# Patient Record
Sex: Male | Born: 1935 | Race: Black or African American | Hispanic: No | Marital: Married | Smoking: Former smoker
Health system: Southern US, Community
[De-identification: ages and names within clinical notes are randomized; demographics above are authoritative.]

## PROBLEM LIST (undated history)

## (undated) DIAGNOSIS — K219 Gastro-esophageal reflux disease without esophagitis: Secondary | ICD-10-CM

## (undated) DIAGNOSIS — I10 Essential (primary) hypertension: Secondary | ICD-10-CM

## (undated) DIAGNOSIS — I509 Heart failure, unspecified: Secondary | ICD-10-CM

## (undated) DIAGNOSIS — H04129 Dry eye syndrome of unspecified lacrimal gland: Secondary | ICD-10-CM

## (undated) HISTORY — DX: Dry eye syndrome of unspecified lacrimal gland: H04.129

## (undated) HISTORY — DX: Gastro-esophageal reflux disease without esophagitis: K21.9

## (undated) HISTORY — PX: OTHER SURGICAL HISTORY: SHX169

## (undated) HISTORY — DX: Essential (primary) hypertension: I10

---

## 1998-12-11 ENCOUNTER — Inpatient Hospital Stay (HOSPITAL_COMMUNITY): Admission: EM | Admit: 1998-12-11 | Discharge: 1998-12-14 | Payer: Self-pay | Admitting: Emergency Medicine

## 1998-12-12 ENCOUNTER — Encounter: Payer: Self-pay | Admitting: Nephrology

## 1998-12-13 ENCOUNTER — Encounter: Payer: Self-pay | Admitting: Nephrology

## 2000-09-05 ENCOUNTER — Observation Stay (HOSPITAL_COMMUNITY): Admission: EM | Admit: 2000-09-05 | Discharge: 2000-09-06 | Payer: Self-pay

## 2000-09-05 ENCOUNTER — Encounter: Payer: Self-pay | Admitting: Nephrology

## 2002-04-03 ENCOUNTER — Encounter: Payer: Self-pay | Admitting: Nephrology

## 2002-04-03 ENCOUNTER — Encounter: Payer: Self-pay | Admitting: Emergency Medicine

## 2002-04-03 ENCOUNTER — Inpatient Hospital Stay (HOSPITAL_COMMUNITY): Admission: EM | Admit: 2002-04-03 | Discharge: 2002-04-07 | Payer: Self-pay | Admitting: Emergency Medicine

## 2002-04-04 ENCOUNTER — Encounter: Payer: Self-pay | Admitting: Nephrology

## 2002-04-06 ENCOUNTER — Encounter (INDEPENDENT_AMBULATORY_CARE_PROVIDER_SITE_OTHER): Payer: Self-pay | Admitting: Specialist

## 2004-07-24 ENCOUNTER — Encounter: Admission: RE | Admit: 2004-07-24 | Discharge: 2004-07-24 | Payer: Self-pay | Admitting: Nephrology

## 2005-06-04 ENCOUNTER — Inpatient Hospital Stay (HOSPITAL_COMMUNITY): Admission: EM | Admit: 2005-06-04 | Discharge: 2005-06-09 | Payer: Self-pay | Admitting: Emergency Medicine

## 2005-06-07 ENCOUNTER — Ambulatory Visit: Payer: Self-pay | Admitting: Internal Medicine

## 2006-06-02 HISTORY — PX: CARDIAC SURGERY: SHX584

## 2006-10-25 ENCOUNTER — Inpatient Hospital Stay (HOSPITAL_COMMUNITY): Admission: EM | Admit: 2006-10-25 | Discharge: 2006-10-28 | Payer: Self-pay | Admitting: Emergency Medicine

## 2006-10-29 ENCOUNTER — Ambulatory Visit: Payer: Self-pay | Admitting: Gastroenterology

## 2007-03-17 ENCOUNTER — Ambulatory Visit: Payer: Self-pay | Admitting: Cardiothoracic Surgery

## 2007-03-18 ENCOUNTER — Encounter (INDEPENDENT_AMBULATORY_CARE_PROVIDER_SITE_OTHER): Payer: Self-pay | Admitting: Nephrology

## 2007-03-18 ENCOUNTER — Inpatient Hospital Stay (HOSPITAL_COMMUNITY): Admission: EM | Admit: 2007-03-18 | Discharge: 2007-03-27 | Payer: Self-pay | Admitting: Emergency Medicine

## 2007-03-22 ENCOUNTER — Encounter: Payer: Self-pay | Admitting: Cardiothoracic Surgery

## 2007-04-22 ENCOUNTER — Encounter: Admission: RE | Admit: 2007-04-22 | Discharge: 2007-04-22 | Payer: Self-pay | Admitting: Cardiothoracic Surgery

## 2007-04-22 ENCOUNTER — Ambulatory Visit: Payer: Self-pay | Admitting: Cardiothoracic Surgery

## 2007-09-14 ENCOUNTER — Inpatient Hospital Stay (HOSPITAL_COMMUNITY): Admission: EM | Admit: 2007-09-14 | Discharge: 2007-09-17 | Payer: Self-pay | Admitting: Emergency Medicine

## 2010-10-15 NOTE — Cardiovascular Report (Signed)
[name], MATTIX NO.:  [id number]   MEDICAL RECORD NO.:  [id number]          PATIENT TYPE:  INP   LOCATION:  2029                         FACILITY:  [facility]   PHYSICIAN:  Ricki Rodriguez, M.D.  DATE OF BIRTH:  1922-11-18   DATE OF PROCEDURE:  03/19/2007  DATE OF DISCHARGE:                            CARDIAC CATHETERIZATION   PROCEDURE:  Left heart catheterization and selective coronary  angiography.   REFERRING PHYSICIAN:  [name], M.D.   INDICATIONS:  This 77 year old black male has congestive heart failure,  hypertension and abnormal cardiac enzymes with recent TEE showing a  large atrial myxoma and a CT of the chest showing the same atrial mass.   APPROACH:  Right femoral artery using 4 French catheter and sheath.   COMPLICATIONS:  None.   Less than 40 mL of dye was used.   HEMODYNAMIC DATA:  The aortic pressure was 130 x 62.   Left atrial mobile mass with vascular supply was partially seen.   CORONARY ANATOMY:  The left main coronary artery was long with luminal  irregularities.   LEFT ANTERIOR DESCENDING CORONARY ARTERY:  The left anterior descending  coronary artery was essentially unremarkable and its diagonal vessel was  also unremarkable.   LEFT CIRCUMFLEX CORONARY ARTERY:  The left circumflex coronary artery  was unremarkable, had a long slender ramus branch and obtuse marginal  branch was unremarkable.   RIGHT CORONARY ARTERY:  The right coronary artery was dominant and  essentially unremarkable.  His posterolateral branch was a slender  vessel and the posterior descending coronary artery was unremarkable.   LEFT VENTRICULOGRAM:  The left ventriculogram was not done but the left  ventricle systolic function was normal by transesophageal  echocardiography.   IMPRESSION:  1. Normal coronaries.  2. Normal LV (left ventricle) systolic function.  3. 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   RECOMMENDATIONS:  This patient  was referred to cardiovascular thoracic  surgeon for surgery for atrial myxoma.      Ricki Rodriguez, M.D.  Electronically Signed     ASK/MEDQ  D:  03/19/2007  T:  03/21/2007  Job:  [id number]

## 2010-10-15 NOTE — Consult Note (Signed)
NAMEANGELA, PLATNER NO.:  [id number]   MEDICAL RECORD NO.:  [id number]          PATIENT TYPE:  INP   LOCATION:  2029                         FACILITY:  [facility]   PHYSICIAN:  Sheliah Plane, MD    DATE OF BIRTH:  26-Jul-1922   DATE OF CONSULTATION:  03/19/2007  DATE OF DISCHARGE:                                 CONSULTATION   REQUESTING PHYSICIAN:  Ricki Rodriguez, M.D.   FOLLOW-UP CARDIOLOGIST:  Ricki Rodriguez, M.D.   PRIMARY CARE PHYSICIAN:  [name], M.D.   REASON FOR CONSULTATION:  Myxoma.   HISTORY OF PRESENT ILLNESS:  The patient is an 77 year old male who over  the years has not had a lot of significant medical problems, first  admitted about 2 weeks ago with an episode of shortness of breath with  exertion.  He notes that he walks about a mile a day, somewhat limited  by chronic arthritic changes in his knees.  With rest the shortness of  breath resolved.  Again, two nights ago while watching presidential  debates on TV the patient became short of breath at rest, called 9-1-1,  and was brought to the emergency room and he was started on oxygen and  noted that he had improved symptoms.  He has had no previous history of  myocardial infarction, cardiac catheterization or angioplasty.  He does  have a history of hypertension.  Lipid status is unknown.  No history of  diabetes.  He is a remote smoker, quit in 1970.   FAMILY HISTORY:  The patient's father died at age 75 of emphysema  secondary to heavy smoking history and his mother died at age 20 of a  myocardial infarction.  He has one sister, who has GI problems.  He  has had no previous stroke.  Denies claudication.   PAST MEDICAL HISTORY:  1. Glaucoma.  2. History of diverticulosis.  He has had a lower GI tract bleed in      the past.  The patient thinks that in April or May of 2008 he had      upper and lower GI endoscopy done locally.   PAST SURGICAL HISTORY:  Left neck surgery for a  benign cyst 6 years ago.   SOCIAL HISTORY:  The patient is married, retired [employer] social research officer.  Does not currently drink alcohol.   MEDICATIONS AT THE TIME OF ADMISSION:  The patient was unsure of the  names.  The patient's record indicates that he takes:   1. Amlodipine 10 mg a day.  2. Doxazosin 2 mg a day.  3. Lasix 40 mg a day.  4. Omeprazole 20 mg a day.  5. Optivar ophthalmic drops daily.   The patient also notes that he takes iron and an arthritis medicine.  He  was recently started on Lanoxin 0.25 mg a day, was given one 75-mg dose  of Plavix and 81 mg of __________.   DRUG ALLERGIES:  None known.   REVIEW OF SYSTEMS:  CARDIAC:  Positive for exertional shortness of  breath, lower  extremity edema.  He denies palpitations, syncope,  presyncope, orthopnea, or resting shortness of breath with the exception  of this admission.  He denies any chest pain.  GENERAL:  The patient  notes that he has had some constitutional symptoms.  His wife notes  generalized fatigue over the last 6 months.  He denies fever, chills,  night sweats or change in weight.  RESPIRATORY:  He denies hemoptysis.  GASTROINTESTINAL:  History of diverticulosis with lower GI bleed in the  past as noted above.  NEUROLOGIC:  Denies amaurosis or TIAs.  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  GU:  Denies hematuria.  Does note some nocturia.  Denies psychiatric history.  Denies amaurosis or TIAs.  Has poor  dentition.   On exam, blood pressure 110/60, sinus at 90, respiratory rate is 18, O2  saturation is 96% on 2 L.  He is feet 10 inches tall, 231 pounds.  The patient is awake, alert, neurologically intact.  NECK:  Without carotid bruits.  LUNGS:  Clear bilaterally.  CARDIAC:  Regular rate and rhythm without murmur or gallop.  ABDOMEN:  Specifically, there is no tumor plop appreciated.  Abdominal  exam is benign without palpable masses or  tenderness.  LOWER EXTREMITIES:  He has somewhat diffusely enlarged knees  bilaterally.  Has +2 DP and PT pulses bilaterally.   Cardiac catheterization films are reviewed.  The patient has no  significant coronary disease.  Echocardiogram was also reviewed and a  4.2-cm pedunculated mass in the left atrium with a fairly small base  attached to the intra-atrial septum is noted.  The mass does approach to  the orifice of the mitral valve in diastole but does not actually go  through the valve.  Overall LV function appears preserved.   IMPRESSION:  Patient with symptoms of congestive heart failure based on  a left atrial myxoma.  With the patient's fairly acute onset of  symptoms, I agree with Dr. Algie Coffer that we should proceed with cardiac  surgery for removal of atrial myxoma.  The diagnosis, risks and options  including the risks of surgery including death, infection, stroke,  myocardial infarction, bleeding, blood transfusion, are all discussed  with the patient and his wife and son in detail.  Their questions have  been answered and the patient is willing to proceed.  We will  discontinue his aspirin and Plavix, check fasting lipid studies and  thyroid function and plan to proceed with excision of myxoma on Monday,  October 20.      Sheliah Plane, MD  Electronically Signed     EG/MEDQ  D:  03/19/2007  T:  03/19/2007  Job:  [id number]   cc:   Ricki Rodriguez, M.D.  [name], M.D.

## 2010-10-15 NOTE — Discharge Summary (Signed)
[name], [name] NO.:  [id number]   MEDICAL RECORD NO.:  [id number]          PATIENT TYPE:  INP   LOCATION:  2011                         FACILITY:  [facility]   PHYSICIAN:  Sheliah Plane, MD    DATE OF BIRTH:  18-Nov-1922   DATE OF ADMISSION:  03/17/2007  DATE OF DISCHARGE:  03/27/2007                               DISCHARGE SUMMARY   PRIMARY ADMITTING DIAGNOSIS:  Shortness of breath.   ADDITIONAL/DISCHARGE DIAGNOSES:  1. Atrial myxoma.  2. History of diverticulosis, status post gastrointestinal bleed in      the past.  3. History of glaucoma.  4. Perioperative bradycardia.   PROCEDURES PERFORMED:  1. Excision of left atrial myxoma.  2. Ligation of left atrial appendage.   HISTORY:  The patient is an 77 year old male with no significant past  medical history.  He began to develop dyspnea on exertion approximately  2 weeks prior to this admission.  On the date of this admission he  developed significant shortness of breath at rest and was brought by EMS  to the emergency department for further evaluation.  Because of his  recurrent symptoms he was seen by Dr. Bascom Levels and was admitted for  further workup.   HOSPITAL COURSE:  Because of his symptoms, a cardiology consultation was  obtained and the patient was seen by Dr. Algie Coffer.  He underwent cardiac  catheterization, which showed no significant coronary artery disease.  An echocardiogram was also performed and he was noted to have a 4.2-cm  pedunculated mass in the left atrium with a fairly small base attached  to the intra-atrial septum..  It was felt that this represented a left  atrial myxoma and that subsequently was causing symptoms of congestive  heart failure.  A cardiothoracic surgery consultation was obtained and  the patient was seen by Dr. Sheliah Plane and films were reviewed.  Dr. Tyrone Sage agreed that his best course of action would be to proceed  with surgical resection of the mass at  this time.  He explained the  risks, benefits and alternatives of the procedure to the patient and he  agreed to proceed with surgery.  He remained stable in hospital during  his preoperative workup.  He was taken to the operating room on March 22, 2007, and underwent a sternotomy with excision of left atrial myxoma  and ligation of left atrial appendage.  He tolerated the procedure well  and was transferred to the SICU in stable condition.  Postoperatively he  was able to be extubated shortly after surgery.  He was hemodynamically  stable on postop day #1.  He was kept in the unit for further  observation and by postop day #2 was transferred to the floor.  His only  postoperative issue has been some bradycardia, which has required atrial  pacing.  He was originally started on a beta blocker postoperatively but  this was discontinued.  His atrial pacer has been discontinued at this  point and he is maintaining heart rates of 70-80.  His blood pressure  has been mildly elevated and he  was started on an ACE inhibitor.  He has  been started on Lasix and is diuresing well for a mild volume overload.  His incisions are all healing well.  His most recent chest x-ray showed  no evidence of infection or acute process.  Labs on postop day #4 showed  sodium 144, potassium 4.0, BUN 7, creatinine 0.92.  His most recent CBC  showed a hemoglobin of 10, hematocrit 31, white count 7.3, platelets  154.  He is ambulating with cardiac rehab phase 1 and is making good  progress.  He is tolerating a regular diet and is having normal bowel  and bladder function.  He will be observed over the next 24 hours off  the pacer and it if his rhythm remains stable and no other acute changes  occur, hopefully he will be ready for discharge home pending morning  round evaluation on March 27, 2007.   DISCHARGE MEDICATIONS:  1. Enteric-coated aspirin 325 mg daily.  2. Omeprazole 20 mg daily.  3. Optivar eye drops  daily.  4. Travatan eye drops daily.  5. Protonix 40 mg daily.   DISCHARGE INSTRUCTIONS:  He is asked to refrain from driving, heavy  lifting or strenuous activity.  He may continue ambulating daily and  using his incentive spirometer.  He may shower daily and clean his  incisions with soap and water.  He will continue a low-fat, low-sodium  diet.   DISCHARGE FOLLOW-UP:  He will see Dr. Algie Coffer in the office in 2 weeks.  He will follow up then with Dr. Tyrone Sage on November 20 at 1 p.m. with a  chest x-ray from [facility].  In the interim if he experiences  problems or has questions, he is asked to contact our office  immediately.      Coral Ceo, P.A.      Sheliah Plane, MD  Electronically Signed    GC/MEDQ  D:  03/26/2007  T:  03/27/2007  Job:  [id number]   cc:   [name], M.D.  Ricki Rodriguez, M.D.

## 2010-10-15 NOTE — Op Note (Signed)
[name], [name] NO.:  [id number]   MEDICAL RECORD NO.:  [id number]          PATIENT TYPE:  INP   LOCATION:  4706                         FACILITY:  [facility]   PHYSICIAN:  Georgiana Spinner, M.D.    DATE OF BIRTH:  12-23-1922   DATE OF PROCEDURE:  10/27/2006  DATE OF DISCHARGE:                               OPERATIVE REPORT   PROCEDURE:  Colonoscopy.   INDICATIONS:  Acute rectal bleeding.   ANESTHESIA:  Demerol 75 mg, Versed 5 mg.  The patient's prep was good.  The patient stated that this started out bloody, but became urine  colored with small particles this morning.   PROCEDURE:  With the patient mildly sedated in the left lateral  decubitus position, a rectal examination revealed a enlarged prostate.  Subsequently the Pentax videoscopic colonoscope was inserted into the  rectum, passed under direct vision through a diverticular filled sigmoid  colon through a diverticular filled the right colon to reach the cecum.  Cecum was identified by ileocecal valve and appendiceal orifice both of  which were photographed, from this point the colonoscope was slowly  withdrawn taking circumferential views of colonic mucosa, stopping to  take photographs of the diverticula seen both in the right and left  colon until we reached the rectum which appeared normal on direct and  retroflexed view.  The endoscope was straightened and withdrawn.  The  patient's vital signs, pulse oximeter remained stable.  The patient  tolerated procedure well without apparent complication.   FINDINGS:  Diffuse diverticulosis more so on the left but still  significant in the right colon otherwise an unremarkable exam.   PLAN:  At this point it appears the patient has stopped bleeding.  There  was no blood seen in the colon at this time, will advance diet and  follow the patient clinically.  If  remains stable, consider discharge  in the morning.           ______________________________  Georgiana Spinner, M.D.     GMO/MEDQ  D:  10/27/2006  T:  10/27/2006  Job:  [id number]   cc:   [name], M.D.

## 2010-10-15 NOTE — Op Note (Signed)
NAMEJASSON, SIEGMANN NO.:  [id number]   MEDICAL RECORD NO.:  [id number]          PATIENT TYPE:  INP   LOCATION:  2011                         FACILITY:  [facility]   PHYSICIAN:  Sheliah Plane, MD    DATE OF BIRTH:  02/03/1923   DATE OF PROCEDURE:  03/22/2007  DATE OF DISCHARGE:  03/27/2007                               OPERATIVE REPORT   PREOPERATIVE DIAGNOSIS:  Atrial myxoma.   POSTOPERATIVE DIAGNOSIS:  Atrial myxoma.   SURGICAL PROCEDURES:  Excision of left atrial myxoma and ligation of  left atrial appendage.   SURGEON:  Sheliah Plane, MD   FIRST ASSISTANT:  Zadie Rhine, PA   BRIEF HISTORY:  The patient is an 77 year old male who had no previous  cardiac history, had about two weeks prior to surgery had onset of  exertional shortness of breath which resolved and then several days  prior to surgery was admitted to the hospital because of recurrent  shortness of breath.  He was seen in the emergency room.  CT scan of the  chest was performed to rule out pulmonary embolus.  This demonstrated a  intracardiac mass.  Echocardiogram confirmed a probable left atrial  myxoma about 5 cm in size.  Cardiac catheterization was performed.  There was no evidence of significant coronary disease.  The patient was  otherwise in good health in surgical resection was recommended.  The  patient agreed and signed informed consent.   DESCRIPTION OF PROCEDURE:  With Swan-Ganz and arterial line monitors in  place, the patient underwent general endotracheal anesthesia without  incident.  Skin of the chest and legs prepped with Betadine and draped  in the usual sterile manner.  Sternotomy was performed.  Pericardium was  opened.  TEE probe had been placed and dictated under separate note but  confirmed a large myxoma appearing tumor attached to the foramen ovalis.  The patient was systemically heparinized.  The ascending aorta was  cannulated.  Superior and inferior vena  caval cannulas were placed.  Right-angle counts were placed directly into the superior and inferior  vena cava and caval tapes were placed around the superior and inferior  vena cava.  In the aortic root bent cardioplegia needle was introduced  in this ascending aorta.  The patient was placed on cardiopulmonary  bypass 2.4 liters per minute per meter squared.  Body temperature was  cooled to 30 degrees.  Aortic crossclamp was applied and 500 mL of cold  blood potassium cardioplegia was administered with diastolic arrest of  the heart.  The right atrium was __________ with arrest of the heart and  myocardial septal temperature was monitored.  Caval tapes were secured.  The right atrium was opened through the right atrial interatrial septum  in the vicinity of fossa ovalis.  The left atrium was entered in the  area of attachment as a pedicle was excised with full-thickness of  atrial wall __________ atrial opening was then enlarged enough to  extract what appeared grossly as a myxoma from the left atrium.  Care  was taken to flush the left atrium  and remove any loose debris.  The  left atrial appendage was easily identified and was oversewn with a  running 4-0 Prolene.  With the intracardiac tumor successfully removed,  the interatrial septum was then closed with a horizontal mattress 4-0  Prolene suture and a second layer of running 4-0 Prolene.  Before  complete closure of atrium, the heart was allowed to passively fill and  de-air the left side.  The right atrium was then closed in a similar  fashion with a running 4-0 Prolene.  Aortic crossclamp was removed.  Total crossclamp time of 51 minutes.  The right heart was filled and de-  aired and the right atrial atriotomy was closure was completed.  The  patient spontaneously converted to a sinus rhythm, further air was  removed through the aortic root vent.  Atrial and ventricular pacing  wires were applied with the patient's body  temperature rewarmed to 37  degrees.  He was then ventilated and weaned from cardiopulmonary bypass  without difficulty, he was decannulated in usual fashion.  Protamine  sulfate was administered.  Total pump time was 77 minutes.  The  atriotomy closure was intact.  The TEE showed intact atrial septum and  overall good LV function.  The pericardium was loosely reapproximated  two Blake drains were left in place.  Sternum was closed #6 stainless  steel wire.  Fascia closed interrupted 0 Vicryl running 3-0 Vicryl  subcutaneous tissue, 4-0 subcuticular stitch in skin edges.  Dry  dressings were applied.  The patient was transferred to surgical  intensive care unit in good condition having tolerated procedure without  obvious complication.      Sheliah Plane, MD  Electronically Signed     EG/MEDQ  D:  03/30/2007  T:  03/31/2007  Job:  [id number]   cc:   [name], M.D.  Ricki Rodriguez, M.D.

## 2010-10-15 NOTE — Assessment & Plan Note (Signed)
OFFICE VISIT   [name], [name]  DOB:  03/09/1923                                        April 22, 2007  CHART #:  [id number]   HISTORY OF PRESENT ILLNESS:  [name] returns to the office today after  resection of his atrial myxoma and closure of the left atrial appendage  on March 22, 2007. Considering his age of 48 years, the patient is  taking excellent progress. Since discharge home, he has increased his  physical activity appropriately. He was interested in doing more  activity. He has had no overt symptoms of congestive heart failure. He  was noted in Dr. Roseanne Kaufman office to be in atrial fibrillation when he  was seen on November 17th and was started on Cardizem, which has slowed  is rate into the 60's. On examination today, he still appears to be in  atrial fibrillation. The left atrial appendage was closed at the time of  surgery. Ideally, it would be good to have the patient on Coumadin to  decrease his stroke risk with atrial fibrillation. However, he has had a  history of multiple GI bleeds, requiring hospitalization, at least 4  times with lower GI bleeds. The risk of Coumadin and/or aspirin would be  great in this patient and so far, has not been started.   PHYSICAL EXAMINATION:  VITAL SIGNS:  Blood pressure 152/81, pulse  62,  respiratory rate 18. O2 sat is 97%.  CHEST:  His sternum is stable and well healed.  LUNGS:  Clear bilaterally.  CARDIOVASCULAR:  I do not hear any murmur or mitral insufficiency.  EXTREMITIES:  He has mild bilateral lower extremity edema but no worse  than the usual.   DIAGNOSTIC STUDIES:  Followup chest x-ray showed clear lung fields  bilaterally.   IMPRESSION:  Overall, [name] is making good progress following the  excision of his left atrial myxoma, which was large enough to be causing  hemodynamic consequences. His main problem now appears to be atrial  fibrillation with the rate controlled and a  history of GI bleed that  makes use of Coumadin contraindicated.   The patient has a followup appointment with Dr. Algie Coffer in 2 weeks, to  check on his heart rhythm. I plan to see him back p.r.n.   Sheliah Plane, MD  Electronically Signed   EG/MEDQ  D:  04/22/2007  T:  04/22/2007  Job:  [id number]   cc:   Ricki Rodriguez, M.D.  [name], M.D.

## 2010-10-18 NOTE — Discharge Summary (Signed)
[name], RUDDY NO.:  [id number]   MEDICAL RECORD NO.:  [id number]          PATIENT TYPE:  INP   LOCATION:  6742                         FACILITY:  [facility]   PHYSICIAN:  [name], M.D.DATE OF BIRTH:  Feb 11, 1923   DATE OF ADMISSION:  06/04/2005  DATE OF DISCHARGE:  06/09/2005                                 DISCHARGE SUMMARY   ADMISSION DIAGNOSIS:  Gastrointestinal hemorrhage.   BRIEF HISTORY AND HOSPITAL COURSE:  The patient is an 77 year old African-  American male who came to the emergency room because he saw blood in the  commode at home on the morning of this admission.  He also noted blood on  the paper for several days prior to admission. Therefore he came to the  emergency room where he had a stool that almost completely filled the bed  pan with blood.  He had seen Dr. Virginia Rochester in 2003.  He was scoped at that time.  He had done well until this recent episode.  He denied having any pain.  He  came in with his wife.  He is retired. He has a history of BPH and  congestive heart failure in the past.  He has arthritis and was on Feldene  10 mg and ranitidine 150 mg, furosemide 40 mg, and doxazosin 2 at night.  He  had BPH, and he has had a great deal of problem with his blood pressure.   PHYSICAL EXAMINATION:  VITAL SIGNS: Temperature 97, pulse 103, blood  pressure 152/84, respirations 20.  GENERAL:  Alert and oriented x3.  HEENT:  Negative.  He does have many missing teeth and a partial upper and  lower.  NECK:  Veins were flat.  CHEST: Clear to auscultation and percussion.  CARDIAC: Regular sinus rhythm.  ABDOMEN: Active bowel sounds, no masses and no organomegaly.  RECTAL: He had good sphincter tone but an enlarged prostate, 4+ guaiac  positive.  NEUROLOGIC:  He can move all his extremities.  His reflexes are bilateral  and equal.  EXTREMITIES:  He had 2+ swelling of both legs.  He wears compression hose.   White count was 6.6, hemoglobin  14.2, so would think of this as dehydration.  Pro time was 13.1, INR 1.  Electrolytes and chemistries were normal.  Glucose 147, BUN 11, creatinine 1.   Chest x-ray was no evidence of congestive heart failure.   The patient was admitted to 6700, and other pertinent labs showed PSA a  little elevated at 5.98 and then 7.75.  Hemoglobin A1c was normal.  Albumin  was a little low at 3.5 and then down to 2.7.  Repeat occult blood was  positive.  As we hydrated the patient, his hemoglobin went all the way down  to 7.6, and we have to give some transfusion.  We did a serum protein  electrophoresis, and it showed a nonspecific pattern.   Dr. Virginia Rochester saw the patient in the hospital for his gastrointestinal bleeding.  Scan was done, and blood pool was normal.  He was not actively bleeding at  that time.  EKG had normal sinus rhythm, nonspecific ST-T wave changes.   The patient was felt improved, and the patient did not have any more GI  bleed as we observed him. We gave him some blood.  He was sitting up, alert.  Hemoglobin was 9.6, hematocrit 28.2.  Temperature 97.6, pulse 88,  respirations 20, blood pressure 140/70.  O2 saturation 98% on room air.  The  patient was discharged on Protonix 40 mg a day, Nu-Iron 150 twice daily and  his other home medications he is to continue except for felodipine.  We do  not want him to continue that.  He will come to my office in 3 to 4 weeks.   DISCHARGE DIAGNOSES:  1.  Gastrointestinal hemorrhage or bleed.  2.  Diverticulosis of the colon.  3.  Benign prostatic hypertrophy.           ______________________________  [name], M.D.     CEF/MEDQ  D:  08/28/2005  T:  08/28/2005  Job:  [id number]

## 2010-10-18 NOTE — Discharge Summary (Signed)
[name], [name] NO.:  [id number]   MEDICAL RECORD NO.:  [id number]          PATIENT TYPE:  INP   LOCATION:  6740                         FACILITY:  [facility]   PHYSICIAN:  [name], M.D.DATE OF BIRTH:  March 16, 1923   DATE OF ADMISSION:  09/14/2007  DATE OF DISCHARGE:  09/17/2007                               DISCHARGE SUMMARY   ADMITTING DIAGNOSIS:  Gastrointestinal hemorrhage and bleeding.   DISCHARGE DIAGNOSES:  1. Gastrointestinal hemorrhage.  2. Atrial flutter.  3. Benign prostate hypertrophy.  4. Esophageal reflux.  5. 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  6. Congestive heart failure.  7. Glaucoma.  8. Atrial fibrillation, controlled.  9. Plasma protein __________ [name].  10.Tobacco abuse.  11.Hypertension.  12.Hemorrhoids.   BRIEF HISTORY, PHYSICAL, AND HOSPITAL COURSE:  [name] is an 77-year-  old African American male with history of several GI bleed in the past.  States that he noted last night that he had some blood in his stool.  Again, he noted this morning that he has some Kaufman blood in his stool.  He has a history of diverticulitis and diverticulosis, and he had severe  GI bleed, causing to have extremely low hemoglobin.  He also has a  history of rapid heart rate, controlled with Cardizem.  He has been  seeing Dr. Algie Coffer, a cardiologist, in the past.  The patient presents  with his wife.   PHYSICAL EXAMINATION:  VITAL SIGNS:  Temperature of 97.1, pulse of 115,  blood pressure 171/93, and respirations are 24.  HEENT:  He has poor dentition.  His extraocular movements were normal.  CHEST:  Clear to auscultation and percussion.  HEART:  Regular sinus rhythm.  ABDOMEN:  He has active bowel sounds.  EXTREMITIES:  He has 1+ to 2+ pitting edema in both legs.  He denies  shortness of breath or leg pain.   He has seen Dr. Virginia Rochester in the past for GI bleeding.  He complains of no  chest pain, and his chest is clear.  Probably,  he has some diverticular  disease.  He has had them in the past.  The patient was admitted.  We  gave him 2 units of packed cells.  His hemoglobin on admission was 10.8.  We gave him a couple units of blood.   LABORATORY DATA:  Electrolytes were fairly normal.  EKG, he had some  sinus bradycardia at one point, gradually that improved.  He has had  some bouts of atrial flutter, gradually brought back into sinus.  Chest  was clear.  __________; however, the bleeding seemingly has improved.  He did not bleed as briskly as he has in the past.  Dr. Virginia Rochester saw the  patient, but he had stopped bleeding by then.  Since he had had a  colonoscopy last year, __________ does not want to do one at this time.  He did have a mild racing heart rate, did slow down to 39.  His blood  pressure was stable, and it went back up to, sometimes the pulse goes  even up  to 127.  One thing that the patient's PSA is 5.89.  On  __________ what we would do is have him see urology after discharge as  an outpatient, and he is scheduled to see his cardiologist as an  outpatient.  We did ask Dr. Algie Coffer to see him while he is in here and  increased his Cardizem by 50%.  He took his Cardizem down, and then his  heart rate went up to 127.  So, we had to readjust it.  His temp was  97.3.  His pulse went up to 127 and blood pressure 108/70.  He was then  put back on his Lopressor and discharged on Lopressor, and we will  restart amlodipine as needed as an outpatient; also Doxazosin, he was on  2 mg; and we will restart Lasix 40 mg as needed as an outpatient;  continue his Protonix 40 mg a day.   On discharge, he was discharged on metoprolol 50 mg b.i.d.  Restart his  potassium, KCl 10 mg one daily, Cardizem 60 mg twice daily, and  lisinopril 5 mg one daily.  The patient is discharged to home.  We will  see him in about 2 weeks, and he is to see Dr. Algie Coffer as an outpatient  as scheduled.            ______________________________  [name], M.D.     CEF/MEDQ  D:  10/21/2007  T:  10/22/2007  Job:  [id number]

## 2010-10-18 NOTE — Op Note (Signed)
   NAMEDEUNDRA, BARD NO.:  [id number]   MEDICAL RECORD NO.:  [id number]                   PATIENT TYPE:  INP   LOCATION:  4727                                 FACILITY:  [facility]   PHYSICIAN:  Georgiana Spinner, M.D.                 DATE OF BIRTH:  [birth year]   DATE OF PROCEDURE:  DATE OF DISCHARGE:                                 OPERATIVE REPORT   PROCEDURE:  Upper endoscopy.   INDICATIONS:  Gastrointestinal bleeding.   ANESTHESIA:  Demerol 50 mg, Versed 5 mg.   DESCRIPTION OF PROCEDURE:  With the patient mildly sedated in the left  lateral decubitus position, the Olympus videoscopic endoscope was inserted  in the mouth, passed under direct vision through the esophagus, which  appeared normal except for a questionable area of short-segment Barrett's  esophagus, which was photographed and biopsied.  We entered into the  stomach.  Fundus, body, antrum, duodenal bulb, second portion of duodenum  all appeared normal.  From this point the endoscope was slowly withdrawn,  taking circumferential views, the entire duodenal mucosa was visualized,  until the endoscope had been pulled back in the stomach, placed in  retroflexion to view the stomach from below.  The endoscope was then  straightened and withdrawn, taking circumferential views of the remaining  gastric and esophageal mucosa.  The patient's vital signs and pulse oximetry  remained stable.  The patient tolerated the procedure well and without  apparent complications.   FINDINGS:  Question of short-segment Barrett's esophagus, photographed and  biopsied.   PLAN:  Await biopsy report.  The patient will call me for results and follow  up as an outpatient.  Proceed to colonoscopy as planned.                                               Georgiana Spinner, M.D.    GMO/MEDQ  D:  04/06/2002  T:  04/06/2002  Job:  [id number]   cc:   [name], M.D.  [address] [place]  [place]  [address]  Fax: [phone]

## 2010-10-18 NOTE — Discharge Summary (Signed)
[name], [name] NO.:  [id number]   MEDICAL RECORD NO.:  [id number]                   PATIENT TYPE:  INP   LOCATION:  4727                                 FACILITY:  [facility]   PHYSICIAN:  [name], M.D.            DATE OF BIRTH:  10/08/1922   DATE OF ADMISSION:  04/03/2002  DATE OF DISCHARGE:  04/07/2002                                 DISCHARGE SUMMARY   ADMISSION DIAGNOSIS:  Gastrointestinal bleeding.   DISCHARGE DIAGNOSIS:  Gastrointestinal bleeding.   HISTORY OF PRESENT ILLNESS:  The patient is a 77 year old African American  gentleman with a history of hypertension and he was admitted with his second  episode of rectal bleeding in the past three years.  The patient was found  by his wife early on the morning of admission, on the floor of the bathroom  and commode full of red and maroon colored blood.  The patient said he was  dizzy and after he got up from the bowel movement, he fell to the floor and  hit his head.  He was brought to the emergency room.  He also had some  bleeding in the emergency room with evacuating his bowel.  The patient had  presented similarly about three years ago.  He was given some blood.  He had  significant diverticulosis without any evidence of diverticulitis.  The  patient has had no subsequent rectal bleeding until this particular episode.   MEDICATIONS:  1. Furosemide daily.  2. Atenolol 25 mg a day.  3. Enteric coated aspirin.  4. Terazosin 2 mg h.s.  5. Ranitidine 150 mg b.i.d.   The patient denies any abdominal pain or discomfort but has some nausea.   PHYSICAL EXAMINATION:  VITAL SIGNS:  Blood pressure 98/57, pulse 100,  respiratory rate 20, temperature 92.  Blood pressure would drop to 84/40,  pulse 120.  HEENT:  The patient is anicteric.  NECK:  Supple.  CHEST:  No rales.  Clear lungs. No tenderness or problems with his chest.  CARDIOVASCULAR:  Regular rhythm, no gallops or rubs.  ABDOMEN:  Soft, nontender.  Slight increase in bowel sounds.  NEUROLOGIC:  The patient is alert, no focal signs.   EKG is normal sinus rhythm with rate of 98, nonspecific T-wave changes.   HOSPITAL COURSE:  A patient with rectal bleeding, probably from his  diverticular disease.  However, it was his second bleed and we probably  should get him scoped.  The patient was transfused several units of blood.  We got Dr. Sabino Gasser to see the patient.  The patient was seen by Dr. Virginia Rochester  who did an endoscopy.  He had some internal hemorrhoids, a small polyp at 10  cm, polyp at 50 cm that were removed.  Diverticulosis was seen throughout  the colon.  The colon showed adenomatous changes but no high grade dysplasia  or  invasive malignancy.  The patient continued to feel improved and receive  his flu and pneumonia vaccine in the hospital.   DISCHARGE MEDICATIONS:  We will discharge him on his present medications.   FOLLOW UP:  He is to return to the office in two weeks.  He is to see Dr.  Virginia Rochester in two to three weeks.  He will probably get some cardiology evaluation  as an outpatient.                                               [name], M.D.    CEF/MEDQ  D:  05/18/2002  T:  05/19/2002  Job:  [id number]

## 2010-10-18 NOTE — Discharge Summary (Signed)
[name], [name] NO.:  [id number]   MEDICAL RECORD NO.:  [id number]          PATIENT TYPE:  INP   LOCATION:  4706                         FACILITY:  [facility]   PHYSICIAN:  [name], M.D.DATE OF BIRTH:  August 30, 1922   DATE OF ADMISSION:  10/25/2006  DATE OF DISCHARGE:  10/28/2006                               DISCHARGE SUMMARY   ADMISSION DIAGNOSIS:  Gastrointestinal hemorrhage.   DISCHARGE DIAGNOSES:  1. Gastrointestinal hemorrhage.  2. Diverticulosis of the colon with hemorrhage.  3. Hypertension.  4. Internal hemorrhoids.  5. Benign prostatic hypertrophy of the prostate.  No evidence of      urinary obstruction.   HISTORY OF PRESENT ILLNESS:  [name] is an 77 year old African  American gentleman who was brought to the emergency room complaining of  some pain and rectal bleeding.  He awoke from sleep.  He had the urge to  pass his bowels, to defecate, and he went to the bathroom and he found  __________ a large amount of dark, maroon-colored stool.  There was no  pain or dizziness, no abdominal pain or vomiting.  He had a similar  event that happened about a year-and-a-half prior to this.  He was seen  by GI, Dr. Biagio Borg, at that time.  They thought he had a Barrett's esophagus.  On colonoscopy he had some small internal hemorrhoids and  diverticulosis.  He had several polyps, 20 cm and 50 cm polyps that were  removed and found to be benign.  The patient also has an enlarged  prostate and he is on Norvasc 5 mg, doxazosin 4 mg, furosemide 40 mg per  day, and Prilosec 20 mg per day.  He is not on aspirin or Coumadin.  He  is not a drinker.  He does not smoke cigarettes.  No drug abuse.   PHYSICAL EXAMINATION:  HEENT:  Physical examination revealed his HEENT  is negative.  CHEST:  His chest was clear to auscultation and percussion.  VITAL SIGNS:  Blood pressure 128/80, pulse 92, respirations normal,  temperature 98.2.  The rest of his exam was  essentially negative.   HOSPITAL COURSE:  The patient was put to bedrest.  Dr. Christella Hartigan of [facility] saw the patient.  Since he is a patient of Dr. Biagio Borg, Dr. Biagio Borg did a  colonoscopy.  He was found to have some diverticulosis.  His GI bleeding  had stopped.  We decided to go ahead and discharge him home.  His  hemoglobin was stable.  We had put him on Nu-Iron 150 mg twice per day.  We are discharging him home.  He is to come to the office in two weeks  and we will follow up on his PSA, and he is to continue his home  medications.           ______________________________  [name], M.D.     CEF/MEDQ  D:  12/17/2006  T:  12/17/2006  Job:  [id number]

## 2010-10-18 NOTE — Consult Note (Signed)
NAME:  [name], [name] NO.:  [id number]   MEDICAL RECORD NO.:  [id number]                   PATIENT TYPE:  INP   LOCATION:  4727                                 FACILITY:  [facility]   PHYSICIAN:  Georgiana Spinner, M.D.                 DATE OF BIRTH:  30-Sep-1922   DATE OF CONSULTATION:  DATE OF DISCHARGE:                                   CONSULTATION   REASON FOR CONSULTATION:  [name] is a very delightful 77 year old  gentleman I have been asked to see because of an acute gastrointestinal  bleed. History is obtained from the patient and is as outlined actually by  Dr. Bascom Levels in his admitting note. This is the second hospitalization for  [name] for an acute gastrointestinal bleed. The first time he says was  approximately three years ago. At that time, he had a barium enema and was  told of diverticulosis. He was passing bright red blood per rectum for a  couple of days and subsequently had done well without any complaints until  this past weekend when he had a second bleed. He was found by his wife on  Sunday morning with a commode full of red to maroon colored blood, the  patient became dizzy, fell to the floor striking his head and he was brought  to the emergency room where he passed a further amount of blood. He has not  passed any further blood, his stools have been brown here in the hospital.  He denies abdominal pain, vomiting and he denies history of ulcer or colon  polyp, colon cancer disease. He has had hypertension for which he takes  atenolol and terazosin. He takes ranitidine b.i.d. along with aspirin 81 mg  and furosemide for peripheral fluid.   ALLERGIES:  He has no drug allergies.   FAMILY HISTORY:  Negative for colon polyps, colon cancer.   SOCIAL HISTORY:  The patient does not smoke, does not drink alcohol to any  degree.   REVIEW OF SYMPTOMS:  At this time entirely negative, no chest pain,  shortness of breath.   PHYSICAL  EXAMINATION:  GENERAL:  Currently he is a very pleasant man  appearing in no distress, lying comfortably in bed. VITAL SIGNS:  Currently  is blood pressure is 140/60 with a pulse of 70, temperature 97.9. On room  air, he has an O2 saturation of 98%. HEENT:  Unremarkable without evidence  of jaundice. Thyroid is not enlarged. No bruits have been heard in the neck.  LUNGS:  Clear. HEART:  Regular rhythm without murmur, gallop or rub.  ABDOMEN:  Benign, somewhat obese but nontender. RECTAL:  Deferred at this  time. NEUROLOGIC:  Normal.   LABORATORY DATA:  Show that his hemoglobin and hematocrit have been  relatively stable approximately around 30 hematocrit now. Liver function  tests, prothrombin time, PTT have been normal.  IMPRESSION:  Acute gastrointestinal bleeding and most signs would point to  this being diverticular but we discussed with the patient endoscopy and  colonoscopy because of the aspirin that he is on. We explained the  procedures, the risks, the benefits, the rationale and he is willing to  proceed. Will prep him today and schedule the procedures for tomorrow.                                                Georgiana Spinner, M.D.    GMO/MEDQ  D:  04/05/2002  T:  04/05/2002  Job:  [id number]   cc:   [name], M.D.  [address] [place]  [place] [address]  Fax: [phone]

## 2010-10-18 NOTE — Discharge Summary (Signed)
   NAMEDORSE, LOCY NO.:  [id number]   MEDICAL RECORD NO.:  [id number]                   PATIENT TYPE:  INP   LOCATION:  4727                                 FACILITY:  [facility]   PHYSICIAN:  [name], M.D.            DATE OF BIRTH:  1923/05/01   DATE OF ADMISSION:  04/03/2002  DATE OF DISCHARGE:  04/07/2002                                 DISCHARGE SUMMARY   ADMISSION DIAGNOSIS:  Rectal and gastrointestinal bleeding.   DISCHARGE DIAGNOSIS:  Gastrointestinal bleeding, subsided.   HISTORY OF PRESENT ILLNESS:  The patient is a 77 year old hypertensive  gentleman who was admitted with the second episode of rectal bleeding in the  last three years.   Dictation ended at this point.                                               [name], M.D.    CEF/MEDQ  D:  05/18/2002  T:  05/18/2002  Job:  [id number]

## 2010-10-18 NOTE — Op Note (Signed)
[name], [name] NO.:  [id number]   MEDICAL RECORD NO.:  [id number]                   PATIENT TYPE:  INP   LOCATION:  4727                                 FACILITY:  [facility]   PHYSICIAN:  Georgiana Spinner, M.D.                 DATE OF BIRTH:  July 30, 1922   DATE OF PROCEDURE:  04/06/2002  DATE OF DISCHARGE:                                 OPERATIVE REPORT   PROCEDURE PERFORMED:  Colonoscopy.   ENDOSCOPIST:  Georgiana Spinner, M.D.   INDICATIONS FOR PROCEDURE:  Rectal bleeding.   ANESTHESIA:  Demerol 20 mg, Versed 1 mg.   DESCRIPTION OF PROCEDURE:  With the patient mildly sedated in the left  lateral decubitus position, the Olympus video colonoscope was inserted in  the rectum and passed under direct vision to the cecum, identified by the  ileocecal valve and appendiceal orifice.  Of note, diverticulosis was seen  throughout the colon, moderately severe.  From this point the colonoscope  was slowly withdrawn taking circumferential views of the entire colonic  mucosa stopping first at approximately 50 cm from the anal verge at which  point, a 1 cm sized polyp, kidney-shaped was photographed and it was removed  using snare cautery technique at a setting of 20/20 blended current and  tissue was retrieved for pathology.  The endoscope was then further  withdrawn all the way to the rectum stopping next at 20 cm from the anal  verge at which point, a second polyp more rounded and slightly smaller,  probably 8 mm in size was seen and photographed.  It too was removed using  snare cautery technique using a setting of 20/20 blended current and tissue  was retrieved for pathology.  The rectum appeared normal on direct and  showed hemorrhoids on retroflex view.  The endoscope was straightened and  withdrawn.  The patient's vital signs and pulse oximeter remained stable.  The patient tolerated the procedure well without apparent complications.   FINDINGS:   Internal hemorrhoids, small.  Polyp at 20 cm, polyp at 50 cm,  both removed using snare cautery technique and diverticulosis seen  throughout the colon, left and right equally seen and moderately severe.    PLAN:  At this point the patient is not actively bleeding.  We will advance  diet as tolerated.  Await biopsy report.  The patient will call me for  results if discharged and follow up with me as an outpatient.                                                  Georgiana Spinner, M.D.    GMO/MEDQ  D:  04/06/2002  T:  04/06/2002  Job:  [id number]   cc:  [name], M.D.  [address] [place]  [place] [address]  Fax: [phone]

## 2010-12-11 ENCOUNTER — Inpatient Hospital Stay (HOSPITAL_COMMUNITY)
Admission: EM | Admit: 2010-12-11 | Discharge: 2010-12-12 | DRG: 378 | Disposition: A | Discharge status: Home / Self Care | Payer: Medicare Other | Attending: Nephrology | Admitting: Nephrology

## 2010-12-11 ENCOUNTER — Emergency Department (HOSPITAL_COMMUNITY): Payer: Medicare Other

## 2010-12-11 DIAGNOSIS — I10 Essential (primary) hypertension: Secondary | ICD-10-CM | POA: Diagnosis present

## 2010-12-11 DIAGNOSIS — N400 Enlarged prostate without lower urinary tract symptoms: Secondary | ICD-10-CM | POA: Diagnosis present

## 2010-12-11 DIAGNOSIS — K219 Gastro-esophageal reflux disease without esophagitis: Secondary | ICD-10-CM | POA: Diagnosis present

## 2010-12-11 DIAGNOSIS — K5731 Diverticulosis of large intestine without perforation or abscess with bleeding: Principal | ICD-10-CM | POA: Diagnosis present

## 2010-12-11 DIAGNOSIS — D649 Anemia, unspecified: Secondary | ICD-10-CM | POA: Diagnosis present

## 2010-12-11 DIAGNOSIS — I4892 Unspecified atrial flutter: Secondary | ICD-10-CM | POA: Diagnosis present

## 2010-12-11 LAB — CBC
HCT: 40.6 % (ref 39.0–52.0)
Hemoglobin: 14.5 g/dL (ref 13.0–17.0)
MCH: 28.5 pg (ref 26.0–34.0)
MCHC: 35.7 g/dL (ref 30.0–36.0)
MCV: 79.9 fL (ref 78.0–100.0)
Platelets: 152 K/uL (ref 150–400)
RBC: 5.08 MIL/uL (ref 4.22–5.81)
RDW: 15.2 % (ref 11.5–15.5)
WBC: 6.9 K/uL (ref 4.0–10.5)

## 2010-12-11 LAB — COMPREHENSIVE METABOLIC PANEL WITH GFR
ALT: 15 U/L (ref 0–53)
AST: 16 U/L (ref 0–37)
CO2: 30 meq/L (ref 19–32)
Calcium: 9.4 mg/dL (ref 8.4–10.5)
Creatinine, Ser: 0.95 mg/dL (ref 0.50–1.35)
GFR calc non Af Amer: >60 mL/min (ref >60)
Sodium: 139 meq/L (ref 135–145)
Total Protein: 7.7 g/dL (ref 6.0–8.3)

## 2010-12-11 LAB — PROTIME-INR
INR: 1.09 (ref 0.00–1.49)
Prothrombin Time: 14.3 s (ref 11.6–15.2)

## 2010-12-11 LAB — DIFFERENTIAL
Basophils Absolute: 0 K/uL (ref 0.0–0.1)
Basophils Relative: 0 % (ref 0–1)
Eosinophils Absolute: 0 K/uL (ref 0.0–0.7)
Eosinophils Relative: 1 % (ref 0–5)
Lymphocytes Relative: 17 % (ref 12–46)
Lymphs Abs: 1.2 K/uL (ref 0.7–4.0)
Monocytes Absolute: 0.4 10*3/uL (ref 0.1–1.0)
Monocytes Relative: 6 % (ref 3–12)
Neutro Abs: 5.2 10*3/uL (ref 1.7–7.7)
Neutrophils Relative %: 76 % (ref 43–77)

## 2010-12-11 LAB — COMPREHENSIVE METABOLIC PANEL
Albumin: 3.4 g/dL — ABNORMAL LOW (ref 3.5–5.2)
Alkaline Phosphatase: 75 U/L (ref 39–117)
BUN: 13 mg/dL (ref 6–23)
Chloride: 102 mEq/L (ref 96–112)
GFR calc Af Amer: >60 mL/min (ref >60)
Glucose, Bld: 126 mg/dL — ABNORMAL HIGH (ref 70–99)
Potassium: 4.1 mEq/L (ref 3.5–5.1)
Total Bilirubin: 0.9 mg/dL (ref 0.3–1.2)

## 2010-12-11 LAB — SAMPLE TO BLOOD BANK

## 2010-12-11 LAB — APTT: aPTT: 31 s (ref 24–37)

## 2010-12-11 MED ORDER — IOHEXOL 300 MG/ML  SOLN
100.0000 mL | Freq: Once | INTRAMUSCULAR | Status: AC | PRN
  Administered 2010-12-11: 100 mL via INTRAVENOUS

## 2010-12-12 LAB — COMPREHENSIVE METABOLIC PANEL
BUN: 12 mg/dL (ref 6–23)
CO2: 25 mEq/L (ref 19–32)
CO2: 26 mEq/L (ref 19–32)
Calcium: 8.6 mg/dL (ref 8.4–10.5)
Calcium: 8.9 mg/dL (ref 8.4–10.5)
Chloride: 103 mEq/L (ref 96–112)
Creatinine, Ser: 0.79 mg/dL (ref 0.50–1.35)
Creatinine, Ser: 0.86 mg/dL (ref 0.50–1.35)
GFR calc Af Amer: >60 mL/min (ref >60)
GFR calc Af Amer: >60 mL/min (ref >60)
GFR calc non Af Amer: >60 mL/min (ref >60)
GFR calc non Af Amer: >60 mL/min (ref >60)
Glucose, Bld: 92 mg/dL (ref 70–99)
Glucose, Bld: 94 mg/dL (ref 70–99)
Total Bilirubin: 1.4 mg/dL — ABNORMAL HIGH (ref 0.3–1.2)

## 2010-12-12 LAB — LIPID PANEL
Cholesterol: 144 mg/dL (ref 0–200)
HDL: 48 mg/dL (ref >39)
HDL: 53 mg/dL (ref >39)
Triglycerides: 113 mg/dL (ref <150)
Triglycerides: 106 mg/dL (ref <150)

## 2010-12-12 LAB — CBC
HCT: 37.1 % — ABNORMAL LOW (ref 39.0–52.0)
Hemoglobin: 12.8 g/dL — ABNORMAL LOW (ref 13.0–17.0)
MCH: 27.6 pg (ref 26.0–34.0)
MCV: 80 fL (ref 78.0–100.0)
Platelets: 146 10*3/uL — ABNORMAL LOW (ref 150–400)
RBC: 4.64 MIL/uL (ref 4.22–5.81)

## 2010-12-19 NOTE — Discharge Summary (Signed)
NAMESTONY, Kaufman NO.:  [id number]  MEDICAL RECORD NO.:  [id number]  LOCATION:  3732                         FACILITY:  [facility]  PHYSICIAN:  [name], M.D.DATE OF BIRTH:  07-25-1922  DATE OF ADMISSION:  12/11/2010 DATE OF DISCHARGE:  12/12/2010                              DISCHARGE SUMMARY   ADMISSION DIAGNOSIS:  Gastrointestinal bleeding after coming in on a trip from other town.  BRIEF HISTORY AND PHYSICAL AND HOSPITAL COURSE:  Mr. [name] is an 77 year old African American male who had been traveling with his wife.  He is actually going to the [facility] where he goes to and they have given him a clean bill of health.  He came to town back to home and while coming in he had a bowel movement and he had blood in it and he had another one with blood, and there was some evidence of bleeding, so he came to the emergency room, and where he was admitted for 12-hour observation.  The patient also had past history of hypertension and he has had some bleeding.  He had a colonoscopy in 2008.  He had some polyps removed by Dr. Virginia Rochester, who has since closed his practice here and moved, and he also had a colonoscopy on May by Dr. Virginia Rochester and he had done well.  Although past history is that Mr. [name] had a mass in his heart removed in the atrial part of his heart.  Since that time, he has had atrial fibrillation disease and he has a history of atrial flutter.  Dr. Algie Coffer had been his cardiologist.  He had surgery and had mass removed out of his heart, and he has done quite well since however, he has had to stay on medication to control his flutter that has become chronic, and he has that all the time, so he is always on medication.  He takes lisinopril 5 mg daily for blood pressure, also doxazosin or Cardura 1 mg at night.  He takes 20 mg dosage of Lasix daily, and he takes metoprolol 50 mg twice a day and that controls his rate of his flutter.  When he was in the  emergency room, he had not taken his evening dose.  He took dose in early in the morning and his heart rate is flutter jumped up to about 120 and 130 and it kept going up and down so finally when he took his metoprolol or Lopressor, it slowed down and he had a heart rate that stayed in the range of 50 to 60 and he felt well.  On hospital course, he got Dr. Elnoria Howard from the practice of Dr. Loreta Ave and Dr. Elnoria Howard, and Dr. Elnoria Howard came and saw him he got the records that Dr. Virginia Rochester had, and it seems that bleeding had pretty much stopped and had diverticular bleed, this has had stopped and he spoke with Mr. Whetsell and decided that if he needed to see him again will be as an outpatient.  We did not think he needs to do anything at this time, although bleeding apparently had stopped, and was decided that he could go home today.  His  temperature was 98.2, his pulse was 54, respirations 18, blood pressure 123/76, so we see that on medication the pulse rate is doing well and we will let him to go home on the present medications.  Should he have any more bleeds, we would refer him back to Dr. Elnoria Howard.  We are now going to discharge Mr. [name] to home on metoprolol and Lopressor 50 mg twice a day, where he has Prilosec at home and here he has been Protonix, where he can take the Prilosec 20 mg daily at home and lisinopril is 5 mg once a day and Cardura/doxazosin 1 mg at night.  He takes Lasix 20 mg and furosemide once daily.  He will continue on that medication and we will go ahead and discharge him and see him back in the office in about 2-3 weeks.         ______________________________ [name], M.D.    CEF/MEDQ  D:  12/12/2010  T:  12/13/2010  Job:  [id number]  Electronically Signed by Jeri Cos M.D. on 12/19/2010 09:11:15 AM

## 2011-01-02 NOTE — Consult Note (Signed)
[name], BONN NO.:  [id number]  MEDICAL RECORD NO.:  [id number]  LOCATION:  3732                         FACILITY:  [facility]  PHYSICIAN:  Jordan Hawks. Elnoria Howard, MD    DATE OF BIRTH:  1922/08/09  DATE OF CONSULTATION:  12/12/2010 DATE OF DISCHARGE:  12/12/2010                                CONSULTATION   REASON FOR CONSULTATION:  Hematochezia.  REFERRING PHYSICIAN:  [name], MD  HISTORY OF PRESENT ILLNESS:  This is an 77 year old gentleman with a past medical history of diverticulosis status post bleed, hypertension, gastroesophageal reflux disease, and status post resection of a left atrial myxoma was admitted to the hospital with complaints of hematochezia.  The patient states that his symptoms started acutely yesterday and was painless.  He has had prior hematochezia episodes before from his diverticula.  As a result of his symptoms, he presented to the emergency room for further evaluation and treatment as he was told to present to the ER in the past with these type of episodes.  In total, the patient reports having 4 bloody bowel movements but his most recent bowel movement appears to be normal.  No complaints of chest pain, shortness of breath with these symptoms and his hemoglobin was noted to have decreased from the 14 range down to 12.  As a result of his symptoms, a GI consultation was requested for further evaluation and treatment.  In the past, he underwent a colonoscopy by Dr. Virginia Rochester on Oct 27, 2006 and also on April 05, 2002.  Both examinations revealed he had extensive diverticulosis.  In addition, he also had an adenoma in his 2003 colonoscopy.  No adenomas were identified during his 2008 colonoscopy and also an EGD was performed in 2003 which was negative for any significant abnormalities.  PAST MEDICAL AND SURGICAL HISTORY:  As stated above.  FAMILY HISTORY:  Noncontributory.  SOCIAL HISTORY:  Negative for alcohol, tobacco,  illicit drug use.  MEDICATIONS: 1. Doxazosin 1 mg p.o. at bedtime. 2. Lasix 20 mg p.o. daily. 3. Lisinopril 5 mg p.o. daily. 4. Metoprolol 50 mg p.o. b.i.d. 5. Protonix 40 mg p.o. daily  ALLERGIES:  No known drug allergies.  PHYSICAL EXAMINATION:  VITAL SIGNS:  Blood pressure is 113/72, respirations 24, pulse is 62, temperature is 97.8. GENERAL:  The patient is in no acute distress, alert and oriented. HEENT:  Normocephalic, atraumatic.  Extraocular muscles intact. NECK:  Supple.  No lymphadenopathy. LUNGS:  Clear to auscultation bilaterally. CARDIOVASCULAR:  Regular rate and rhythm. ABDOMEN:  Obese, soft, nontender, nondistended. EXTREMITIES:  No clubbing, cyanosis, or edema.  LABORATORY VALUES:  White blood cell count is 5.9, hemoglobin 12.8 decreased from 14.5 and platelets at 164,000.  PT is 14.3, INR 1.0, PTT 31.  Sodium was 140, potassium 3.8, chloride 103, CO2 26, glucose 94, BUN 12, creatinine is 0.8, total bilirubin 1.4, alk phos 70, AST 17, ALT 14, albumin 3.3.  IMPRESSION: 1. Diverticular bleed. 2. Mild anemia secondary to diverticular bleed.  Again I did review     the patient's prior 2 colonoscopies by Dr. Virginia Rochester and is significant     for extensive diverticulosis.  Clinically  appears that this is the     etiology of his hematochezia.  No complaints of any abdominal pain,     chest pain, or shortness of breath.  His last bowel movement which     I was able to examine appears to be normal.  There is only scant     trace of blood at this time.  The patient desires to go home at     this time and I do not feel that it is unreasonable to allow him to     go home.  He knows to call if he has any further bleeding episodes     as this is not a new event from the past.  Plan at this time to     continue with supportive care.  Discharge per Dr. Bascom Levels and no GI     intervention at this time.     Jordan Hawks Elnoria Howard, MD     PDH/MEDQ  D:  12/12/2010  T:  12/12/2010  Job:   [id number]  Electronically Signed by Jeani Hawking MD on 01/02/2011 11:21:42 AM

## 2011-02-25 LAB — DIFFERENTIAL
Basophils Absolute: 0
Basophils Relative: 0
Basophils Relative: 2 — ABNORMAL HIGH
Eosinophils Relative: 1
Eosinophils Relative: 1
Eosinophils Relative: 2
Lymphs Abs: 1.5
Lymphs Abs: 1.5
Monocytes Absolute: 0.3
Monocytes Absolute: 0.4
Monocytes Absolute: 0.4
Monocytes Relative: 8
Neutro Abs: 3.1

## 2011-02-25 LAB — CBC
HCT: 37.4 — ABNORMAL LOW
HCT: 33 — ABNORMAL LOW
HCT: 33.2 — ABNORMAL LOW
Hemoglobin: 12.4 — ABNORMAL LOW
Hemoglobin: 10.8 — ABNORMAL LOW
Hemoglobin: 10.9 — ABNORMAL LOW
MCHC: 32
MCHC: 32.8
MCHC: 32.8
MCV: 73.6 — ABNORMAL LOW
MCV: 73.9 — ABNORMAL LOW
Platelets: 202
Platelets: 170
RBC: 4.47
RBC: 5.14
RDW: 23.1 — ABNORMAL HIGH
RDW: 23.1 — ABNORMAL HIGH
RDW: 23.7 — ABNORMAL HIGH
WBC: 5.1
WBC: 5.3

## 2011-02-25 LAB — TYPE AND SCREEN: ABO/RH(D): B POS

## 2011-02-25 LAB — COMPREHENSIVE METABOLIC PANEL
ALT: 20
AST: 26
Albumin: 4
Calcium: 9.7
GFR calc Af Amer: >60
Potassium: 3.7
Sodium: 141
Total Protein: 8

## 2011-02-25 LAB — CARDIAC PANEL(CRET KIN+CKTOT+MB+TROPI)
CK, MB: 1.9
Relative Index: INVALID
Total CK: 61
Total CK: 63
Total CK: 64
Troponin I: 0.02
Troponin I: 0.01
Troponin I: 0.01

## 2011-02-25 LAB — POCT I-STAT, CHEM 8
Creatinine, Ser: 1.3
HCT: 42
Hemoglobin: 14.3
Potassium: 3.8
Sodium: 141
TCO2: 25

## 2011-02-25 LAB — PSA: PSA: 5.89 — ABNORMAL HIGH

## 2011-02-25 LAB — APTT: aPTT: 32

## 2011-02-25 LAB — RENAL FUNCTION PANEL
CO2: 26
Calcium: 8.5
Chloride: 106
Creatinine, Ser: 0.91
GFR calc Af Amer: >60
GFR calc non Af Amer: >60
Glucose, Bld: 111 — ABNORMAL HIGH

## 2011-02-25 LAB — PROTIME-INR
INR: 1
Prothrombin Time: 13.5

## 2011-03-12 LAB — CBC
HCT: 29.2 — ABNORMAL LOW
HCT: 29.6 — ABNORMAL LOW
HCT: 30.4 — ABNORMAL LOW
HCT: 30.5 — ABNORMAL LOW
HCT: 31.5 — ABNORMAL LOW
HCT: 36.3 — ABNORMAL LOW
HCT: 36.5 — ABNORMAL LOW
HCT: 37.2 — ABNORMAL LOW
HCT: 38.4 — ABNORMAL LOW
HCT: 45.1
Hemoglobin: 10.2 — ABNORMAL LOW
Hemoglobin: 11.8 — ABNORMAL LOW
Hemoglobin: 12.4 — ABNORMAL LOW
Hemoglobin: 13.2
Hemoglobin: 9.4 — ABNORMAL LOW
Hemoglobin: 9.7 — ABNORMAL LOW
Hemoglobin: 9.7 — ABNORMAL LOW
Hemoglobin: 9.9 — ABNORMAL LOW
MCHC: 31.9
MCHC: 32.2
MCHC: 32.2
MCHC: 32.2
MCHC: 32.3
MCHC: 32.4
MCHC: 32.5
MCHC: 32.5
MCHC: 32.8
MCV: 76.8 — ABNORMAL LOW
MCV: 76.9 — ABNORMAL LOW
MCV: 77.2 — ABNORMAL LOW
MCV: 77.3 — ABNORMAL LOW
MCV: 77.4 — ABNORMAL LOW
MCV: 77.4 — ABNORMAL LOW
MCV: 77.5 — ABNORMAL LOW
MCV: 77.6 — ABNORMAL LOW
MCV: 77.8 — ABNORMAL LOW
MCV: 77.8 — ABNORMAL LOW
Platelets: 116 — ABNORMAL LOW
Platelets: 120 — ABNORMAL LOW
Platelets: 122 — ABNORMAL LOW
Platelets: 125 — ABNORMAL LOW
Platelets: 139 — ABNORMAL LOW
Platelets: 154
Platelets: 174
Platelets: 185
Platelets: 188
Platelets: 191
RBC: 3.77 — ABNORMAL LOW
RBC: 3.83 — ABNORMAL LOW
RBC: 3.9 — ABNORMAL LOW
RBC: 3.91 — ABNORMAL LOW
RBC: 4.07 — ABNORMAL LOW
RBC: 4.72
RBC: 4.72
RBC: 4.83
RBC: 4.99
RBC: 5.28
RDW: 17.5 — ABNORMAL HIGH
RDW: 17.6 — ABNORMAL HIGH
RDW: 17.8 — ABNORMAL HIGH
RDW: 17.8 — ABNORMAL HIGH
RDW: 18 — ABNORMAL HIGH
RDW: 18 — ABNORMAL HIGH
RDW: 18 — ABNORMAL HIGH
RDW: 18.1 — ABNORMAL HIGH
RDW: 18.2 — ABNORMAL HIGH
WBC: 5.6
WBC: 5.9
WBC: 6.3
WBC: 6.5
WBC: 6.5
WBC: 7.3
WBC: 7.7
WBC: 8.1
WBC: 8.3
WBC: 9.2

## 2011-03-12 LAB — HEMOGLOBIN A1C
Hgb A1c MFr Bld: 6.3 — ABNORMAL HIGH
Mean Plasma Glucose: 147

## 2011-03-12 LAB — I-STAT 8, (EC8 V) (CONVERTED LAB)
BUN: 11
Bicarbonate: 25.3 — ABNORMAL HIGH
Chloride: 102
Glucose, Bld: 138 — ABNORMAL HIGH
HCT: 34 — ABNORMAL LOW
Hemoglobin: 11.6 — ABNORMAL LOW
pCO2, Ven: 48.7

## 2011-03-12 LAB — COMPREHENSIVE METABOLIC PANEL
ALT: 26
ALT: 44
ALT: 51
AST: 37
AST: 60 — ABNORMAL HIGH
Albumin: 2.9 — ABNORMAL LOW
Albumin: 2.9 — ABNORMAL LOW
Albumin: 3.3 — ABNORMAL LOW
Alkaline Phosphatase: 62
Alkaline Phosphatase: 64
Alkaline Phosphatase: 99
BUN: 10
BUN: 11
BUN: 8
CO2: 24
CO2: 29
CO2: 31
Calcium: 8.4
Calcium: 8.5
Calcium: 9.1
Chloride: 100
Chloride: 105
Chloride: 106
Chloride: 98
Creatinine, Ser: 0.85
Creatinine, Ser: 0.85
Creatinine, Ser: 1.03
GFR calc Af Amer: >60
GFR calc Af Amer: >60
GFR calc non Af Amer: 59 — ABNORMAL LOW
GFR calc non Af Amer: >60
GFR calc non Af Amer: >60
Glucose, Bld: 100 — ABNORMAL HIGH
Glucose, Bld: 108 — ABNORMAL HIGH
Glucose, Bld: 130 — ABNORMAL HIGH
Potassium: 3.6
Potassium: 3.8
Potassium: 4.4
Sodium: 135
Sodium: 135
Sodium: 139
Total Bilirubin: 1
Total Bilirubin: 1
Total Bilirubin: 1.2
Total Bilirubin: 2 — ABNORMAL HIGH
Total Protein: 5.3 — ABNORMAL LOW
Total Protein: 5.3 — ABNORMAL LOW
Total Protein: 7.8

## 2011-03-12 LAB — POCT I-STAT 3, ART BLOOD GAS (G3+)
Acid-Base Excess: 3 — ABNORMAL HIGH
Acid-base deficit: 5 — ABNORMAL HIGH
Bicarbonate: 29.5 — ABNORMAL HIGH
O2 Saturation: 100
Operator id: 113031
Operator id: 298181
Operator id: 3390
Patient temperature: 36.1
Patient temperature: 36.3
Patient temperature: 37
TCO2: 31
pCO2 arterial: 46.3 — ABNORMAL HIGH
pO2, Arterial: 260 — ABNORMAL HIGH
pO2, Arterial: 447 — ABNORMAL HIGH

## 2011-03-12 LAB — POCT I-STAT 4, (NA,K, GLUC, HGB,HCT)
Glucose, Bld: 109 — ABNORMAL HIGH
Glucose, Bld: 117 — ABNORMAL HIGH
Glucose, Bld: 89
HCT: 26 — ABNORMAL LOW
HCT: 32 — ABNORMAL LOW
Hemoglobin: 11.2 — ABNORMAL LOW
Hemoglobin: 8.8 — ABNORMAL LOW
Hemoglobin: 9.2 — ABNORMAL LOW
Operator id: 3390
Operator id: 3390
Potassium: 3.4 — ABNORMAL LOW
Potassium: 3.6
Potassium: 4.3
Sodium: 138
Sodium: 141

## 2011-03-12 LAB — RENAL FUNCTION PANEL
Albumin: 2.8 — ABNORMAL LOW
Albumin: 3.1 — ABNORMAL LOW
Albumin: 3.3 — ABNORMAL LOW
BUN: 10
CO2: 26
CO2: 30
Chloride: 101
Chloride: 102
Chloride: 107
Creatinine, Ser: 0.77
GFR calc Af Amer: >60
GFR calc Af Amer: >60
GFR calc non Af Amer: >60
GFR calc non Af Amer: >60
Glucose, Bld: 110 — ABNORMAL HIGH
Potassium: 3.9
Potassium: 4
Potassium: 4.2
Sodium: 141

## 2011-03-12 LAB — BLOOD GAS, ARTERIAL
Acid-Base Excess: 5.1 — ABNORMAL HIGH
Bicarbonate: 30.2 — ABNORMAL HIGH
Drawn by: 290171
FIO2: 0.28
O2 Saturation: 97.9
Patient temperature: 98.6
TCO2: 31.9
pCO2 arterial: 54.3 — ABNORMAL HIGH
pH, Arterial: 7.364
pO2, Arterial: 104 — ABNORMAL HIGH

## 2011-03-12 LAB — TROPONIN I: Troponin I: 0.05

## 2011-03-12 LAB — DIFFERENTIAL
Basophils Absolute: 0
Basophils Absolute: 0
Basophils Absolute: 0
Basophils Absolute: 0
Basophils Relative: 1
Basophils Relative: 1
Eosinophils Absolute: 0
Eosinophils Absolute: 0.2
Eosinophils Relative: 0
Eosinophils Relative: 1
Eosinophils Relative: 3
Lymphocytes Relative: 10 — ABNORMAL LOW
Lymphocytes Relative: 13
Lymphocytes Relative: 16
Lymphocytes Relative: 33
Lymphs Abs: 0.8
Lymphs Abs: 1.1
Lymphs Abs: 2.2
Monocytes Absolute: 0.4
Monocytes Absolute: 0.5
Monocytes Absolute: 0.5
Monocytes Relative: 6
Monocytes Relative: 7
Neutro Abs: 3.7
Neutro Abs: 5.1
Neutro Abs: 6.8
Neutro Abs: 7
Neutrophils Relative %: 57
Neutrophils Relative %: 71
Neutrophils Relative %: 83 — ABNORMAL HIGH

## 2011-03-12 LAB — TYPE AND SCREEN
ABO/RH(D): B POS
Antibody Screen: NEGATIVE

## 2011-03-12 LAB — PROTIME-INR
INR: 1
INR: 1
INR: 1.2
INR: 1.2
INR: 1.3
Prothrombin Time: 13.1
Prothrombin Time: 15.3 — ABNORMAL HIGH
Prothrombin Time: 15.5 — ABNORMAL HIGH
Prothrombin Time: 16.9 — ABNORMAL HIGH

## 2011-03-12 LAB — LIPID PANEL
Cholesterol: 151
HDL: 43
LDL Cholesterol: 87
Total CHOL/HDL Ratio: 3.5
Triglycerides: 107
VLDL: 21

## 2011-03-12 LAB — URINALYSIS, ROUTINE W REFLEX MICROSCOPIC
Glucose, UA: NEGATIVE
Ketones, ur: NEGATIVE
Specific Gravity, Urine: 1.036 — ABNORMAL HIGH
pH: 6

## 2011-03-12 LAB — CK TOTAL AND CKMB (NOT AT ARMC)
CK, MB: 1.8
CK, MB: 27.3 — ABNORMAL HIGH
Relative Index: 7.1 — ABNORMAL HIGH
Total CK: 382 — ABNORMAL HIGH

## 2011-03-12 LAB — POCT I-STAT 3, VENOUS BLOOD GAS (G3P V)
Bicarbonate: 27 — ABNORMAL HIGH
TCO2: 29
pCO2, Ven: 38.2 — ABNORMAL LOW
pH, Ven: 7.423 — ABNORMAL HIGH
pO2, Ven: 61 — ABNORMAL HIGH

## 2011-03-12 LAB — CREATININE, SERUM
Creatinine, Ser: 0.86
Creatinine, Ser: 0.93
GFR calc Af Amer: >60
GFR calc Af Amer: >60
GFR calc non Af Amer: >60
GFR calc non Af Amer: >60

## 2011-03-12 LAB — BASIC METABOLIC PANEL
BUN: 7
CO2: 29
Calcium: 9
Chloride: 107
Creatinine, Ser: 0.92
GFR calc Af Amer: >60
GFR calc non Af Amer: >60
Glucose, Bld: 103 — ABNORMAL HIGH
Potassium: 4
Sodium: 144

## 2011-03-12 LAB — APTT
aPTT: 36
aPTT: 39 — ABNORMAL HIGH

## 2011-03-12 LAB — POTASSIUM: Potassium: 3.9

## 2011-03-12 LAB — TSH: TSH: 1.882

## 2011-03-12 LAB — MAGNESIUM
Magnesium: 2.2
Magnesium: 2.2
Magnesium: 2.2

## 2011-03-12 LAB — URIC ACID: Uric Acid, Serum: 6.3

## 2011-03-12 LAB — BLEEDING TIME: Bleeding Time: 9 — ABNORMAL HIGH

## 2011-03-12 LAB — B-NATRIURETIC PEPTIDE (CONVERTED LAB)
Pro B Natriuretic peptide (BNP): 75
Pro B Natriuretic peptide (BNP): 96

## 2011-03-12 LAB — HEMOGLOBIN AND HEMATOCRIT, BLOOD: HCT: 24.7 — ABNORMAL LOW

## 2011-03-12 LAB — POCT CARDIAC MARKERS: CKMB, poc: 2.5

## 2011-03-12 LAB — HEPARIN LEVEL (UNFRACTIONATED): Heparin Unfractionated: <0.1 — ABNORMAL LOW

## 2014-04-12 ENCOUNTER — Ambulatory Visit: Payer: Self-pay | Admitting: Podiatry

## 2014-05-02 ENCOUNTER — Encounter (HOSPITAL_BASED_OUTPATIENT_CLINIC_OR_DEPARTMENT_OTHER): Payer: Medicare HMO | Attending: General Surgery

## 2014-05-02 DIAGNOSIS — I10 Essential (primary) hypertension: Secondary | ICD-10-CM | POA: Insufficient documentation

## 2014-05-02 DIAGNOSIS — I272 Other secondary pulmonary hypertension: Secondary | ICD-10-CM | POA: Diagnosis not present

## 2014-05-02 DIAGNOSIS — L97919 Non-pressure chronic ulcer of unspecified part of right lower leg with unspecified severity: Secondary | ICD-10-CM | POA: Diagnosis not present

## 2014-05-02 DIAGNOSIS — L84 Corns and callosities: Secondary | ICD-10-CM | POA: Diagnosis not present

## 2014-05-02 DIAGNOSIS — K219 Gastro-esophageal reflux disease without esophagitis: Secondary | ICD-10-CM | POA: Diagnosis not present

## 2014-05-02 NOTE — H&P (Signed)
[name]:  Hilgers, Damaria                 ACCOUNT NO.:  [id number]  MEDICAL RECORD NO.:  [id number]  LOCATION:  FOOT                         FACILITY:  [facility]  PHYSICIAN:  Joanne Gaveloy Andalyn Heckstall, M.D.        DATE OF BIRTH:  [birth year]  DATE OF ADMISSION:  05/02/2014 DATE OF DISCHARGE:                             HISTORY & PHYSICAL   CHIEF COMPLAINT:  Wound, right leg.  HISTORY OF PRESENT ILLNESS:  This is an 80 year old male with a history of a great deal of swelling and edema, develops some blisters, most of them have healed, but there is 1 tiny wound left on the right anterior leg.  PAST MEDICAL HISTORY:  Significant for hypertension, GERD, and lower extremity edema.  PAST SURGICAL HISTORY:  He has had open heart surgery for removal of growth.  SOCIAL HISTORY:  Cigarettes none.  Alcohol none.  MEDICATIONS:  Furosemide, lisinopril, doxazosin, metoprolol and omeprazole.  ALLERGIES:  None.  REVIEW OF SYSTEMS:  He has multiple joint pains related to arthritis.  PHYSICAL EXAMINATION:  VITAL SIGNS:  Temperature 97, pulse 97, respirations 16, blood pressure 114/86. GENERAL APPEARANCE:  Well developed, well nourished, in no distress. CHEST:  Clear. HEART:  Regular rhythm. EXTREMITIES:  Examination of the lower extremity reveals an ABI of 0.93. There is a great deal of edema and it is difficult to feel peripheral pulses on that basis on the anterior surface of the right leg after a scab was removed.  There is a 0.6 x 0.5 x 0.1 lesion, which appears clean.  On the right small toe, there is a small callus.  IMPRESSION:  Chronic venous hypertension with edema and ulceration and inflammation.  PLAN OF TREATMENT: 1. I have ordered arterial studies and dressed wound with silver     alginate and Coban. 2. He will be seen in 7 days.     Joanne Gaveloy Edana Aguado, M.D.     RA/MEDQ  D:  05/02/2014  T:  05/02/2014  Job:  [id number]

## 2014-05-03 ENCOUNTER — Ambulatory Visit: Payer: Self-pay | Admitting: Podiatry

## 2014-05-29 ENCOUNTER — Encounter: Payer: Self-pay | Admitting: Podiatry

## 2014-05-29 ENCOUNTER — Ambulatory Visit (INDEPENDENT_AMBULATORY_CARE_PROVIDER_SITE_OTHER): Payer: Medicare HMO | Admitting: Podiatry

## 2014-05-29 VITALS — BP 97/70 | HR 100 | Resp 16 | Ht 71.0 in | Wt 227.0 lb

## 2014-05-29 DIAGNOSIS — M79676 Pain in unspecified toe(s): Secondary | ICD-10-CM

## 2014-05-29 DIAGNOSIS — B351 Tinea unguium: Secondary | ICD-10-CM

## 2014-05-29 DIAGNOSIS — L84 Corns and callosities: Secondary | ICD-10-CM

## 2014-05-29 NOTE — Progress Notes (Signed)
   Subjective:    Patient ID: [name], male    DOB: 01/14/1923, 80 y.o.   MRN: [id number]  HPI Comments: N corn L right 5th toe D and O 10 to 12 days C hard painful skin A TED hoses and enclosed shoes T  Drs. Evans and Blunt prescribed Mupirocin ointment    Toe Pain    patient also complaining of painful toenails when walking wearing shoes and request debridement of toenails    Review of Systems  HENT: Positive for congestion and sinus pressure.   Cardiovascular: Positive for leg swelling.  All other systems reviewed and are negative.      Objective:   Physical Exam  Orientated 3  Vascular: +4 pitting edema bilaterally Patient wearing compression hose bilaterally DP pulses 1/4 bilaterally PT pulses 1/4 bilaterally Capillary reflex immediate bilaterally  Neurological: Sensation to 10 g monofilament wire intact 5/5 bilaterally Vibratory sensation intact bilaterally Ankle reflex equal and reactive bilaterally  Dermatological: The toenails are elongated, hypertrophic, discolored, incurvated and tender to palpation 6-10 Keratoses lateral fifth right toe at the nail groove area  Musculoskeletal: Patient walks slowly with cane Pes planus bilaterally Adductovarus fifth toes bilaterally      Assessment & Plan:   Assessment: Edema bilaterally Symptomatic onychomycoses 6-10 Keratoses fifth right toe  Plan: Debrided toenails 10 and keratoses 1 without a bleeding  Reappoint recommended at three-month intervals for debridement of painful nails and keratoses

## 2014-05-29 NOTE — Patient Instructions (Signed)
Return every 3 months for debridement of thick fungal toenails and corn on fifth right toe Removed bandage and fifth right toe the next time you bathe or shower

## 2014-07-04 ENCOUNTER — Encounter (HOSPITAL_COMMUNITY): Payer: Self-pay | Admitting: General Practice

## 2014-07-04 ENCOUNTER — Encounter (HOSPITAL_COMMUNITY): Payer: Medicare HMO

## 2014-07-04 ENCOUNTER — Encounter: Payer: Medicare HMO | Admitting: Vascular Surgery

## 2014-07-04 ENCOUNTER — Inpatient Hospital Stay (HOSPITAL_COMMUNITY)
Admission: AD | Admit: 2014-07-04 | Discharge: 2014-07-07 | DRG: 292 | Disposition: A | Discharge status: Home / Self Care | Payer: Medicare HMO | Source: Ambulatory Visit | Attending: Cardiovascular Disease | Admitting: Cardiovascular Disease

## 2014-07-04 DIAGNOSIS — Z87891 Personal history of nicotine dependence: Secondary | ICD-10-CM

## 2014-07-04 DIAGNOSIS — I10 Essential (primary) hypertension: Secondary | ICD-10-CM | POA: Diagnosis present

## 2014-07-04 DIAGNOSIS — I959 Hypotension, unspecified: Secondary | ICD-10-CM | POA: Diagnosis present

## 2014-07-04 DIAGNOSIS — F0390 Unspecified dementia without behavioral disturbance: Secondary | ICD-10-CM | POA: Diagnosis present

## 2014-07-04 DIAGNOSIS — K219 Gastro-esophageal reflux disease without esophagitis: Secondary | ICD-10-CM | POA: Diagnosis present

## 2014-07-04 DIAGNOSIS — J449 Chronic obstructive pulmonary disease, unspecified: Secondary | ICD-10-CM | POA: Diagnosis present

## 2014-07-04 DIAGNOSIS — D709 Neutropenia, unspecified: Secondary | ICD-10-CM | POA: Diagnosis present

## 2014-07-04 DIAGNOSIS — E119 Type 2 diabetes mellitus without complications: Secondary | ICD-10-CM | POA: Diagnosis present

## 2014-07-04 DIAGNOSIS — I071 Rheumatic tricuspid insufficiency: Secondary | ICD-10-CM | POA: Diagnosis present

## 2014-07-04 DIAGNOSIS — Z6833 Body mass index (BMI) 33.0-33.9, adult: Secondary | ICD-10-CM | POA: Diagnosis not present

## 2014-07-04 DIAGNOSIS — T465X5A Adverse effect of other antihypertensive drugs, initial encounter: Secondary | ICD-10-CM | POA: Diagnosis present

## 2014-07-04 DIAGNOSIS — I4892 Unspecified atrial flutter: Secondary | ICD-10-CM | POA: Diagnosis present

## 2014-07-04 DIAGNOSIS — R600 Localized edema: Secondary | ICD-10-CM | POA: Diagnosis present

## 2014-07-04 DIAGNOSIS — I5031 Acute diastolic (congestive) heart failure: Principal | ICD-10-CM | POA: Diagnosis present

## 2014-07-04 DIAGNOSIS — R0602 Shortness of breath: Secondary | ICD-10-CM | POA: Diagnosis present

## 2014-07-04 DIAGNOSIS — D696 Thrombocytopenia, unspecified: Secondary | ICD-10-CM | POA: Diagnosis present

## 2014-07-04 DIAGNOSIS — I5021 Acute systolic (congestive) heart failure: Secondary | ICD-10-CM | POA: Diagnosis present

## 2014-07-04 HISTORY — DX: Heart failure, unspecified: I50.9

## 2014-07-04 LAB — COMPREHENSIVE METABOLIC PANEL
ALBUMIN: 3.2 g/dL — AB (ref 3.5–5.2)
ALT: 14 U/L (ref 0–53)
AST: 29 U/L (ref 0–37)
Alkaline Phosphatase: 98 U/L (ref 39–117)
Anion gap: 12 (ref 5–15)
BUN: 24 mg/dL — ABNORMAL HIGH (ref 6–23)
CHLORIDE: 108 mmol/L (ref 96–112)
CO2: 22 mmol/L (ref 19–32)
CREATININE: 1.43 mg/dL — AB (ref 0.50–1.35)
Calcium: 9.6 mg/dL (ref 8.4–10.5)
GFR calc Af Amer: 49 mL/min — ABNORMAL LOW (ref >90)
GFR calc non Af Amer: 42 mL/min — ABNORMAL LOW (ref >90)
Glucose, Bld: 85 mg/dL (ref 70–99)
Potassium: 4.1 mmol/L (ref 3.5–5.1)
Sodium: 142 mmol/L (ref 135–145)
Total Bilirubin: 1.1 mg/dL (ref 0.3–1.2)
Total Protein: 7.3 g/dL (ref 6.0–8.3)

## 2014-07-04 LAB — CBC WITH DIFFERENTIAL/PLATELET
BASOS ABS: 0 10*3/uL (ref 0.0–0.1)
BASOS PCT: 0 % (ref 0–1)
EOS PCT: 1 % (ref 0–5)
Eosinophils Absolute: 0 10*3/uL (ref 0.0–0.7)
HCT: 37.2 % — ABNORMAL LOW (ref 39.0–52.0)
HEMOGLOBIN: 13 g/dL (ref 13.0–17.0)
LYMPHS ABS: 0.6 10*3/uL — AB (ref 0.7–4.0)
Lymphocytes Relative: 24 % (ref 12–46)
MCH: 27.8 pg (ref 26.0–34.0)
MCHC: 34.9 g/dL (ref 30.0–36.0)
MCV: 79.7 fL (ref 78.0–100.0)
MONO ABS: 0.3 10*3/uL (ref 0.1–1.0)
MONOS PCT: 11 % (ref 3–12)
Neutro Abs: 1.6 10*3/uL — ABNORMAL LOW (ref 1.7–7.7)
Neutrophils Relative %: 65 % (ref 43–77)
PLATELETS: 45 10*3/uL — AB (ref 150–400)
RBC: 4.67 MIL/uL (ref 4.22–5.81)
RDW: 16.9 % — AB (ref 11.5–15.5)
WBC: 2.4 10*3/uL — AB (ref 4.0–10.5)

## 2014-07-04 LAB — TROPONIN I: Troponin I: <0.03 ng/mL (ref <0.031)

## 2014-07-04 LAB — BRAIN NATRIURETIC PEPTIDE: B Natriuretic Peptide: 187.7 pg/mL — ABNORMAL HIGH (ref 0.0–100.0)

## 2014-07-04 LAB — TSH: TSH: 3.759 u[IU]/mL (ref 0.350–4.500)

## 2014-07-04 MED ORDER — PANTOPRAZOLE SODIUM 40 MG PO TBEC
40.0000 mg | DELAYED_RELEASE_TABLET | Freq: Every day | ORAL | Status: DC
  Administered 2014-07-05 – 2014-07-07 (×3): 40 mg via ORAL
  Filled 2014-07-04 (×3): qty 1

## 2014-07-04 MED ORDER — DOXAZOSIN MESYLATE 4 MG PO TABS
4.0000 mg | ORAL_TABLET | Freq: Every day | ORAL | Status: DC
  Administered 2014-07-06: 4 mg via ORAL
  Filled 2014-07-04 (×3): qty 1

## 2014-07-04 MED ORDER — CARBOXYMETHYLCELLULOSE SODIUM 1 % OP SOLN: 1.0000 [drp] | Freq: Two times a day (BID) | OPHTHALMIC | Status: DC

## 2014-07-04 MED ORDER — ASPIRIN EC 81 MG PO TBEC
81.0000 mg | DELAYED_RELEASE_TABLET | Freq: Every day | ORAL | Status: DC
Start: 2014-07-04 — End: 2014-07-07
  Administered 2014-07-04 – 2014-07-07 (×4): 81 mg via ORAL
  Filled 2014-07-04 (×4): qty 1

## 2014-07-04 MED ORDER — SODIUM CHLORIDE 0.9 % IJ SOLN: 3.0000 mL | INTRAMUSCULAR | Status: DC | PRN

## 2014-07-04 MED ORDER — FUROSEMIDE 10 MG/ML IJ SOLN
40.0000 mg | Freq: Two times a day (BID) | INTRAMUSCULAR | Status: DC
  Administered 2014-07-04 – 2014-07-06 (×5): 40 mg via INTRAVENOUS
  Filled 2014-07-04 (×6): qty 4

## 2014-07-04 MED ORDER — SODIUM CHLORIDE 0.9 % IV SOLN: 250.0000 mL | INTRAVENOUS | Status: DC | PRN

## 2014-07-04 MED ORDER — LISINOPRIL 5 MG PO TABS
5.0000 mg | ORAL_TABLET | Freq: Every day | ORAL | Status: DC
  Administered 2014-07-05 – 2014-07-06 (×2): 5 mg via ORAL
  Filled 2014-07-04 (×2): qty 1

## 2014-07-04 MED ORDER — ONDANSETRON HCL 4 MG/2ML IJ SOLN: 4.0000 mg | Freq: Four times a day (QID) | INTRAMUSCULAR | Status: DC | PRN

## 2014-07-04 MED ORDER — METOPROLOL TARTRATE 50 MG PO TABS
50.0000 mg | ORAL_TABLET | Freq: Two times a day (BID) | ORAL | Status: DC
  Administered 2014-07-05: 50 mg via ORAL
  Filled 2014-07-04 (×3): qty 1

## 2014-07-04 MED ORDER — POLYVINYL ALCOHOL 1.4 % OP SOLN
1.0000 [drp] | Freq: Two times a day (BID) | OPHTHALMIC | Status: DC
Start: 2014-07-04 — End: 2014-07-07
  Administered 2014-07-04 – 2014-07-07 (×4): 1 [drp] via OPHTHALMIC
  Filled 2014-07-04: qty 15

## 2014-07-04 MED ORDER — SODIUM CHLORIDE 0.9 % IJ SOLN
3.0000 mL | Freq: Two times a day (BID) | INTRAMUSCULAR | Status: DC
  Administered 2014-07-04 – 2014-07-07 (×5): 3 mL via INTRAVENOUS

## 2014-07-04 MED ORDER — HEPARIN SODIUM (PORCINE) 5000 UNIT/ML IJ SOLN: 5000.0000 [IU] | Freq: Three times a day (TID) | INTRAMUSCULAR | Status: DC

## 2014-07-04 MED ORDER — METOPROLOL TARTRATE 50 MG PO TABS: 50.0000 mg | ORAL_TABLET | Freq: Every day | ORAL | Status: DC

## 2014-07-04 MED ORDER — ACETAMINOPHEN 325 MG PO TABS: 650.0000 mg | ORAL_TABLET | ORAL | Status: DC | PRN

## 2014-07-04 MED ORDER — ACETAMINOPHEN 500 MG PO TABS
500.0000 mg | ORAL_TABLET | Freq: Two times a day (BID) | ORAL | Status: DC
  Administered 2014-07-04 – 2014-07-06 (×4): 500 mg via ORAL
  Filled 2014-07-04 (×7): qty 1

## 2014-07-04 NOTE — Progress Notes (Signed)
Spoke to DR Algie CofferKadakia re episodes of bradycardia going down to low 30's while sleeping and goes up when pt is awaken. No c/o chest pains.orders receive to hold cardura. Continued to monitor pt

## 2014-07-04 NOTE — Progress Notes (Signed)
MD paged that pt has arrived to floor. Sherald BargeSpencer, Karandeep Resende T

## 2014-07-04 NOTE — Progress Notes (Signed)
Pharmacy notified regarding pt's metoprolol not being verified.  Pt normally takes metoprolol 2x daily and has already taken one dose today.  Pharmacy states they will send someone up to complete medication reconciliation immediately.  Will continue to monitor. Sherald BargeSpencer, Aryah Doering T

## 2014-07-04 NOTE — H&P (Signed)
Referring Physician:  MOSTAFA YUAN is an 81 y.o. male.                       Chief Complaint: Increasing leg edema and some shortness of breath.  HPI: 81 year old male with past medical history of hypertension, gastroesophageal reflux disease has progressive worsening of leg edema x 2 years and some shortness of breath x 1 week. No chest pain or fever or cough.  Past Medical History  Diagnosis Date  . Hypertension   . GERD (gastroesophageal reflux disease)   . Dry eye   . CHF (congestive heart failure)       Past Surgical History  Procedure Laterality Date  . Cardiac surgery  2008    pt states he had agrowth removed from his heart  . Facial growth      History reviewed. No pertinent family history. Social History:  reports that he has quit smoking. He has never used smokeless tobacco. He reports that he does not drink alcohol or use illicit drugs.  Allergies: No Known Allergies  Medications Prior to Admission  Medication Sig Dispense Refill  . acetaminophen (TYLENOL) 500 MG tablet Take 500 mg by mouth 2 (two) times daily.    Marland Kitchen doxazosin (CARDURA) 4 MG tablet Take 4 mg by mouth daily.    . furosemide (LASIX) 20 MG tablet Take 20 mg by mouth.    Marland Kitchen lisinopril (PRINIVIL,ZESTRIL) 5 MG tablet Take 5 mg by mouth daily.    . metoprolol (LOPRESSOR) 50 MG tablet Take 50 mg by mouth daily.    . Multiple Vitamins-Minerals (MULTI COMPLETE PO) Take by mouth.    . mupirocin ointment (BACTROBAN) 2 % Place 1 application into the nose 2 (two) times daily.    Marland Kitchen omeprazole (PRILOSEC) 20 MG capsule Take 20 mg by mouth daily.    Vladimir Faster Glycol-Propyl Glycol (SYSTANE OP) Apply to eye.    . Polyvinyl Alcohol-Povidone (REFRESH OP) Apply to eye.      Results for orders placed or performed during the hospital encounter of 07/04/14 (from the past 48 hour(s))  CBC WITH DIFFERENTIAL     Status: Abnormal   Collection Time: 07/04/14  3:30 PM  Result Value Ref Range   WBC 2.4 (L) 4.0 - 10.5 K/uL   RBC 4.67 4.22 - 5.81 MIL/uL   Hemoglobin 13.0 13.0 - 17.0 g/dL   HCT 37.2 (L) 39.0 - 52.0 %   MCV 79.7 78.0 - 100.0 fL   MCH 27.8 26.0 - 34.0 pg   MCHC 34.9 30.0 - 36.0 g/dL   RDW 16.9 (H) 11.5 - 15.5 %   Platelets 45 (L) 150 - 400 K/uL    Comment: PLATELET COUNT CONFIRMED BY SMEAR SPECIMEN CHECKED FOR CLOTS    Neutrophils Relative % 65 43 - 77 %   Neutro Abs 1.6 (L) 1.7 - 7.7 K/uL   Lymphocytes Relative 24 12 - 46 %   Lymphs Abs 0.6 (L) 0.7 - 4.0 K/uL   Monocytes Relative 11 3 - 12 %   Monocytes Absolute 0.3 0.1 - 1.0 K/uL   Eosinophils Relative 1 0 - 5 %   Eosinophils Absolute 0.0 0.0 - 0.7 K/uL   Basophils Relative 0 0 - 1 %   Basophils Absolute 0.0 0.0 - 0.1 K/uL  Comprehensive metabolic panel     Status: Abnormal   Collection Time: 07/04/14  3:30 PM  Result Value Ref Range   Sodium 142 135 -  145 mmol/L   Potassium 4.1 3.5 - 5.1 mmol/L   Chloride 108 96 - 112 mmol/L   CO2 22 19 - 32 mmol/L   Glucose, Bld 85 70 - 99 mg/dL   BUN 24 (H) 6 - 23 mg/dL   Creatinine, Ser 1.43 (H) 0.50 - 1.35 mg/dL   Calcium 9.6 8.4 - 10.5 mg/dL   Total Protein 7.3 6.0 - 8.3 g/dL   Albumin 3.2 (L) 3.5 - 5.2 g/dL   AST 29 0 - 37 U/L   ALT 14 0 - 53 U/L   Alkaline Phosphatase 98 39 - 117 U/L   Total Bilirubin 1.1 0.3 - 1.2 mg/dL   GFR calc non Af Amer 42 (L) >90 mL/min   GFR calc Af Amer 49 (L) >90 mL/min    Comment: (NOTE) The eGFR has been calculated using the CKD EPI equation. This calculation has not been validated in all clinical situations. eGFR's persistently <90 mL/min signify possible Chronic Kidney Disease.    Anion gap 12 5 - 15  Brain natriuretic peptide     Status: Abnormal   Collection Time: 07/04/14  3:30 PM  Result Value Ref Range   B Natriuretic Peptide 187.7 (H) 0.0 - 100.0 pg/mL  Troponin I     Status: None   Collection Time: 07/04/14  3:30 PM  Result Value Ref Range   Troponin I <0.03 <0.031 ng/mL    Comment:        NO INDICATION OF MYOCARDIAL INJURY.   TSH      Status: None   Collection Time: 07/04/14  3:30 PM  Result Value Ref Range   TSH 3.759 0.350 - 4.500 uIU/mL   No results found.  Review Of Systems + wears glasses, + weight gain + partial dentures, + COPD, + angina, + leg edema, + hypertension, + DM, II, + CHF, No CVA, No seizures, No kidney stone, + arthritis.  Physical Exam:  Blood pressure 120/83, pulse 93, temperature 97.7 F (36.5 C), resp. rate 20, height _0  (1.753 m), weight 107.457 kg (236 lb 14.4 oz), SpO2 99 %.  General: Well built and overnourished in no respiratory distress. HEENT: [place]/AT, brown eyes, PERL, EOMI. conjuntiva-pink, sclera-white. Neck: No JVD, no bruit, no thyromegaly.  Lungs: Clear to auscultation.  Heart: Normal S1 and S2. Grade III/VI systolic murmur LSB  Abdomen: Mild swelling, non-tender.  Ext: Massive + edema up to thighs, bilaterally.  CNS: Cranial nerves grossly intact. Moves all 4 extremities. Skin: Warm and dry.  Assessment/Plan Bilateral leg edema R/O CHF. Hypertension Obesity  IV lasix/Home medications/Echocardiogram and blood work.  Birdie Riddle, MD  07/04/2014, 5:59 PM

## 2014-07-05 ENCOUNTER — Inpatient Hospital Stay (HOSPITAL_COMMUNITY): Payer: Medicare HMO

## 2014-07-05 LAB — BASIC METABOLIC PANEL
ANION GAP: 6 (ref 5–15)
BUN: 22 mg/dL (ref 6–23)
CO2: 32 mmol/L (ref 19–32)
Calcium: 9.7 mg/dL (ref 8.4–10.5)
Chloride: 104 mmol/L (ref 96–112)
Creatinine, Ser: 1.42 mg/dL — ABNORMAL HIGH (ref 0.50–1.35)
GFR calc Af Amer: 49 mL/min — ABNORMAL LOW (ref >90)
GFR calc non Af Amer: 42 mL/min — ABNORMAL LOW (ref >90)
Glucose, Bld: 85 mg/dL (ref 70–99)
Potassium: 3.9 mmol/L (ref 3.5–5.1)
SODIUM: 142 mmol/L (ref 135–145)

## 2014-07-05 LAB — TROPONIN I: Troponin I: <0.03 ng/mL (ref <0.031)

## 2014-07-05 NOTE — Progress Notes (Signed)
Pt still having episodes of bradycardia this time upper 20's which went back to 60's when pt was awakened. Denies any c/o chest pains nor dizziness nor weakness.continued to monitor patient carefully

## 2014-07-05 NOTE — Care Management Note (Addendum)
    Page 1 of 2   07/07/2014     4:00:15 PM CARE MANAGEMENT NOTE 07/07/2014  Patient:  [name], [name]   Account Number:  [id number]  Date Initiated:  07/05/2014  Documentation initiated by:  Lorne Skeens  Subjective/Objective Assessment:   Pt was admitted with increasing leg edema, shortness of breath.  Lives at home with spouse.     Action/Plan:   Will follow for discharge needs.   Anticipated DC Date:  07/09/2014   Anticipated DC Plan:  [place]  CM consult      [facility] Choice  HOME HEALTH   Choice offered to / List presented to:  C-1 Patient        [place] arranged  HH-1 RN  [place] PT      [place].   Status of service:  Completed, signed off Medicare Important Message given?  YES (If response is "NO", the following Medicare IM given date fields will be blank) Date Medicare IM given:  07/07/2014 Medicare IM given by:  Johnwesley Lederman Date Additional Medicare IM given:   Additional Medicare IM given by:    Discharge Disposition:  HOME/SELF CARE  Per UR Regulation:  Reviewed for med. necessity/level of care/duration of stay  If discussed at [place] of Stay Meetings, dates discussed:    Comments:  07/07/14 Ellan Lambert, RN, BSN [phone] Pt for dc home today with spouse.  Pt/wife agreeable to [facility] follow up.  Referral to [facility], per wife's choice.  Start of care 24-48h post dc date.  07/06/14 Ellan Lambert, RN, BSN [phone] PT/OT evals pending.  Met with pt and wife; pt would be good candidate for [facility] for CHF follow up.  Will follow progress.

## 2014-07-05 NOTE — Progress Notes (Signed)
Pt's HR dropping, lowest is 22.  MD notified. Sherald BargeSpencer, Baleigh Rennaker T

## 2014-07-05 NOTE — Progress Notes (Signed)
Ref: [name],John Gosney S, MD   Subjective:  Significant sinus bradycardia on B-blocker use. Feels better.  Objective:  Vital Signs in the last 24 hours: Temp:  [97 F (36.1 C)-97.1 F (36.2 C)] 97.1 F (36.2 C) (02/03 0624) Pulse Rate:  [81-95] 95 (02/03 0920) Cardiac Rhythm:  [-]  Resp:  [20] 20 (02/03 0624) BP: (92-107)/(53-77) 101/69 mmHg (02/03 0920) SpO2:  [100 %] 100 % (02/03 0624) Weight:  [106.6 kg (235 lb 0.2 oz)-107.457 kg (236 lb 14.4 oz)] 106.6 kg (235 lb 0.2 oz) (02/03 16100624)  Physical Exam: BP Readings from Last 1 Encounters:  07/05/14 101/69    Wt Readings from Last 1 Encounters:  07/05/14 106.6 kg (235 lb 0.2 oz)    Weight change:   HEENT: /AT, Eyes-Brown, PERL, EOMI, Conjunctiva-Pink, Sclera-Non-icteric Neck: No JVD, No bruit, Trachea midline. Lungs:  Clear, Bilateral. Cardiac:  Regular rhythm, normal S1 and S2, no S3. III/VI systolic murmur. Abdomen:  Soft, non-tender. Extremities:  2 + edema present. No cyanosis. No clubbing. CNS: AxOx3, Cranial nerves grossly intact, moves all 4 extremities. Right handed. Skin: Warm and dry.   Intake/Output from previous day: 02/02 0701 - 02/03 0700 In: 603 [P.O.:600; I.V.:3] Out: 950 [Urine:950]    Lab Results: BMET    Component Value Date/Time   NA 142 07/05/2014 0315   NA 142 07/04/2014 1530   NA 140 12/12/2010 0652   K 3.9 07/05/2014 0315   K 4.1 07/04/2014 1530   K 3.8 12/12/2010 0652   CL 104 07/05/2014 0315   CL 108 07/04/2014 1530   CL 103 12/12/2010 0652   CO2 32 07/05/2014 0315   CO2 22 07/04/2014 1530   CO2 26 12/12/2010 0652   GLUCOSE 85 07/05/2014 0315   GLUCOSE 85 07/04/2014 1530   GLUCOSE 94 12/12/2010 0652   BUN 22 07/05/2014 0315   BUN 24* 07/04/2014 1530   BUN 12 12/12/2010 0652   CREATININE 1.42* 07/05/2014 0315   CREATININE 1.43* 07/04/2014 1530   CREATININE 0.86 12/12/2010 0652   CALCIUM 9.7 07/05/2014 0315   CALCIUM 9.6 07/04/2014 1530   CALCIUM 8.9 12/12/2010 0652   GFRNONAA  42* 07/05/2014 0315   GFRNONAA 42* 07/04/2014 1530   GFRNONAA >60 12/12/2010 0652   GFRAA 49* 07/05/2014 0315   GFRAA 49* 07/04/2014 1530   GFRAA >60 12/12/2010 0652   CBC    Component Value Date/Time   WBC 2.4* 07/04/2014 1530   RBC 4.67 07/04/2014 1530   HGB 13.0 07/04/2014 1530   HCT 37.2* 07/04/2014 1530   PLT 45* 07/04/2014 1530   MCV 79.7 07/04/2014 1530   MCH 27.8 07/04/2014 1530   MCHC 34.9 07/04/2014 1530   RDW 16.9* 07/04/2014 1530   LYMPHSABS 0.6* 07/04/2014 1530   MONOABS 0.3 07/04/2014 1530   EOSABS 0.0 07/04/2014 1530   BASOSABS 0.0 07/04/2014 1530   HEPATIC Function Panel  Recent Labs  07/04/14 1530  PROT 7.3   HEMOGLOBIN A1C No components found for: HGA1C,  MPG CARDIAC ENZYMES Lab Results  Component Value Date   CKTOTAL 63 09/16/2007   CKMB 2.4 09/16/2007   TROPONINI <0.03 07/05/2014   TROPONINI <0.03 07/04/2014   TROPONINI <0.03 07/04/2014   BNP No results for input(Kaufman): PROBNP in the last 8760 hours. TSH  Recent Labs  07/04/14 1530  TSH 3.759   CHOLESTEROL No results for input(Kaufman): CHOL in the last 8760 hours.  Scheduled Meds: . acetaminophen  500 mg Oral BID  . aspirin EC  81 mg  Oral Daily  . doxazosin  4 mg Oral QHS  . furosemide  40 mg Intravenous Q12H  . lisinopril  5 mg Oral Daily  . pantoprazole  40 mg Oral Daily  . polyvinyl alcohol  1 drop Both Eyes BID  . sodium chloride  3 mL Intravenous Q12H   Continuous Infusions:  PRN Meds:.sodium chloride, acetaminophen, ondansetron (ZOFRAN) IV, sodium chloride  Assessment/Plan: Bilateral leg edema CHF, unlikely. Hypertension Obesity   DC metoprolol. Check echocardiogram   LOS: 1 day    Orpah Cobb  MD  07/05/2014, 1:01 PM

## 2014-07-05 NOTE — Progress Notes (Signed)
UR complete.  Marissia Blackham RN, MSN 

## 2014-07-05 NOTE — Progress Notes (Signed)
  Echocardiogram 2D Echocardiogram has been performed.  Arvil ChacoFoster, General Wearing 07/05/2014, 10:36 AM

## 2014-07-06 LAB — URINALYSIS, ROUTINE W REFLEX MICROSCOPIC
Bilirubin Urine: NEGATIVE
Glucose, UA: NEGATIVE mg/dL
Hgb urine dipstick: NEGATIVE
KETONES UR: NEGATIVE mg/dL
Leukocytes, UA: NEGATIVE
Nitrite: NEGATIVE
Protein, ur: NEGATIVE mg/dL
Specific Gravity, Urine: 1.013 (ref 1.005–1.030)
Urobilinogen, UA: 1 mg/dL (ref 0.0–1.0)
pH: 5 (ref 5.0–8.0)

## 2014-07-06 LAB — BASIC METABOLIC PANEL
Anion gap: 10 (ref 5–15)
BUN: 24 mg/dL — ABNORMAL HIGH (ref 6–23)
CALCIUM: 9.6 mg/dL (ref 8.4–10.5)
CHLORIDE: 105 mmol/L (ref 96–112)
CO2: 27 mmol/L (ref 19–32)
Creatinine, Ser: 1.42 mg/dL — ABNORMAL HIGH (ref 0.50–1.35)
GFR, EST AFRICAN AMERICAN: 49 mL/min — AB (ref >90)
GFR, EST NON AFRICAN AMERICAN: 42 mL/min — AB (ref >90)
Glucose, Bld: 70 mg/dL (ref 70–99)
POTASSIUM: 4.1 mmol/L (ref 3.5–5.1)
SODIUM: 142 mmol/L (ref 135–145)

## 2014-07-06 LAB — CBC
HEMATOCRIT: 35.5 % — AB (ref 39.0–52.0)
Hemoglobin: 12.4 g/dL — ABNORMAL LOW (ref 13.0–17.0)
MCH: 28.4 pg (ref 26.0–34.0)
MCHC: 34.9 g/dL (ref 30.0–36.0)
MCV: 81.2 fL (ref 78.0–100.0)
Platelets: 53 10*3/uL — ABNORMAL LOW (ref 150–400)
RBC: 4.37 MIL/uL (ref 4.22–5.81)
RDW: 16.9 % — ABNORMAL HIGH (ref 11.5–15.5)
WBC: 2.2 10*3/uL — ABNORMAL LOW (ref 4.0–10.5)

## 2014-07-06 LAB — GLUCOSE, CAPILLARY: Glucose-Capillary: 101 mg/dL — ABNORMAL HIGH (ref 70–99)

## 2014-07-06 MED ORDER — METOPROLOL TARTRATE 12.5 MG HALF TABLET
12.5000 mg | ORAL_TABLET | Freq: Two times a day (BID) | ORAL | Status: DC
  Administered 2014-07-06: 12.5 mg via ORAL
  Filled 2014-07-06 (×2): qty 1

## 2014-07-06 MED ORDER — SODIUM CHLORIDE 0.9 % IV SOLN
INTRAVENOUS | Status: DC
  Administered 2014-07-06: 18:00:00 via INTRAVENOUS

## 2014-07-06 NOTE — Clinical Documentation Improvement (Signed)
"+   DM, II" is documented in the Review of Systems section of the H&P.  Blood glucose within normal limits this admission.  No medications for DM 2 prior to admission or during this admission.  Please clarify if the patient has Diabetes Mellitus.    Thank You, Jerral Ralphathy R Bich Mchaney ,RN Clinical Documentation Specialist:  [phone] [facility] Management

## 2014-07-06 NOTE — Progress Notes (Signed)
Ref: Ricki Rodriguez, MD   Subjective:  Patient had borderline hyperglycemia 8 years ago. He has maintained normal sugar levels for few years and hence he has diet controlled DM, II or DM, II has resolved for now.  Heart rate improved and now over 90/min with minimal activity.  Objective:  Vital Signs in the last 24 hours: Temp:  [96.7 F (35.9 C)-97.8 F (36.6 C)] 96.7 F (35.9 C) (02/04 0540) Pulse Rate:  [46-95] 93 (02/04 0540) Cardiac Rhythm:  [-] Sinus bradycardia (02/03 2000) Resp:  [17-18] 17 (02/04 0540) BP: (97-120)/(64-77) 109/76 mmHg (02/04 0540) SpO2:  [98 %-100 %] 98 % (02/04 0540) Weight:  [103.8 kg (228 lb 13.4 oz)] 103.8 kg (228 lb 13.4 oz) (02/04 0540)  Physical Exam: BP Readings from Last 1 Encounters:  07/06/14 109/76    Wt Readings from Last 1 Encounters:  07/06/14 103.8 kg (228 lb 13.4 oz)    Weight change: -3.6 kg (-7 lb 15 oz)  HEENT: [place]/AT, Eyes-Brown, PERL, EOMI, Conjunctiva-Pink, Sclera-Non-icteric Neck: No JVD, No bruit, Trachea midline. Lungs:  Clear, Bilateral. Cardiac:  Regular rhythm, normal S1 and S2, no S3. III/VI systolic murmur LSB. Abdomen:  Soft, non-tender. Extremities:  2 + edema present. No cyanosis. No clubbing. CNS: AxOx3, Cranial nerves grossly intact, moves all 4 extremities. Right handed. Skin: Warm and dry.   Intake/Output from previous day: 02/03 0701 - 02/04 0700 In: 840 [P.O.:840] Out: 1377 [Urine:1376; Stool:1]    Lab Results: BMET    Component Value Date/Time   NA 142 07/06/2014 0444   NA 142 07/05/2014 0315   NA 142 07/04/2014 1530   K 4.1 07/06/2014 0444   K 3.9 07/05/2014 0315   K 4.1 07/04/2014 1530   CL 105 07/06/2014 0444   CL 104 07/05/2014 0315   CL 108 07/04/2014 1530   CO2 27 07/06/2014 0444   CO2 32 07/05/2014 0315   CO2 22 07/04/2014 1530   GLUCOSE 70 07/06/2014 0444   GLUCOSE 85 07/05/2014 0315   GLUCOSE 85 07/04/2014 1530   BUN 24* 07/06/2014 0444   BUN 22 07/05/2014 0315   BUN 24*  07/04/2014 1530   CREATININE 1.42* 07/06/2014 0444   CREATININE 1.42* 07/05/2014 0315   CREATININE 1.43* 07/04/2014 1530   CALCIUM 9.6 07/06/2014 0444   CALCIUM 9.7 07/05/2014 0315   CALCIUM 9.6 07/04/2014 1530   GFRNONAA 42* 07/06/2014 0444   GFRNONAA 42* 07/05/2014 0315   GFRNONAA 42* 07/04/2014 1530   GFRAA 49* 07/06/2014 0444   GFRAA 49* 07/05/2014 0315   GFRAA 49* 07/04/2014 1530   CBC    Component Value Date/Time   WBC 2.2* 07/06/2014 0444   RBC 4.37 07/06/2014 0444   HGB 12.4* 07/06/2014 0444   HCT 35.5* 07/06/2014 0444   PLT 53* 07/06/2014 0444   MCV 81.2 07/06/2014 0444   MCH 28.4 07/06/2014 0444   MCHC 34.9 07/06/2014 0444   RDW 16.9* 07/06/2014 0444   LYMPHSABS 0.6* 07/04/2014 1530   MONOABS 0.3 07/04/2014 1530   EOSABS 0.0 07/04/2014 1530   BASOSABS 0.0 07/04/2014 1530   HEPATIC Function Panel  Recent Labs  07/04/14 1530  PROT 7.3   HEMOGLOBIN A1C No components found for: HGA1C,  MPG CARDIAC ENZYMES Lab Results  Component Value Date   CKTOTAL 63 09/16/2007   CKMB 2.4 09/16/2007   TROPONINI <0.03 07/05/2014   TROPONINI <0.03 07/04/2014   TROPONINI <0.03 07/04/2014   BNP No results for input(s): PROBNP in the last 8760 hours. TSH  Recent Labs  07/04/14 1530  TSH 3.759   CHOLESTEROL No results for input(s): CHOL in the last 8760 hours.  Scheduled Meds: . acetaminophen  500 mg Oral BID  . aspirin EC  81 mg Oral Daily  . doxazosin  4 mg Oral QHS  . furosemide  40 mg Intravenous Q12H  . lisinopril  5 mg Oral Daily  . pantoprazole  40 mg Oral Daily  . polyvinyl alcohol  1 drop Both Eyes BID  . sodium chloride  3 mL Intravenous Q12H   Continuous Infusions:  PRN Meds:.sodium chloride, acetaminophen, ondansetron (ZOFRAN) IV, sodium chloride  Assessment/Plan: Bilateral leg edema CHF, unlikely. Hypertension Obesity   Resume metoprolol at 25 % dose.    LOS: 2 days    Orpah CobbAjay Haruna Rohlfs  MD  07/06/2014, 8:53 AM

## 2014-07-06 NOTE — Progress Notes (Signed)
After failed atempts to get temp oral and axillary, rectal temp done and was 92.2, Dr. Algie CofferKadakia notified and bld cx and UA ordered, also 250 cc NS bolus ordered.  Bear hugger ordered and 6 warm blankets put on pt until equipment arrives, CBG 101, Dr Algie CofferKadakia saw pt on floor and ordered another 250 NS bolus (total 500cc bolus), pt asymptomatic, temp rechecked at 1540 91.8 and BP 97/79, pt stable

## 2014-07-06 NOTE — Progress Notes (Signed)
Pt a/o, no c/o pain, no c/o SOB, pt oob with cane and supervision, diuresing well, pt stable

## 2014-07-06 NOTE — Progress Notes (Signed)
@   0204 Hr dropped to 27 and difficult to arouse. Pause of 4.59. Hr back up to 60's-90's. Dr Algie CofferKadakia notified as this writer continued to monitor patient as pt did not experience bradycardia severely again tonight.

## 2014-07-07 LAB — BASIC METABOLIC PANEL WITH GFR
Anion gap: 4 — ABNORMAL LOW (ref 5–15)
BUN: 24 mg/dL — ABNORMAL HIGH (ref 6–23)
CO2: 34 mmol/L — ABNORMAL HIGH (ref 19–32)
Calcium: 9.8 mg/dL (ref 8.4–10.5)
Chloride: 108 mmol/L (ref 96–112)
Creatinine, Ser: 1.53 mg/dL — ABNORMAL HIGH (ref 0.50–1.35)
GFR calc Af Amer: 45 mL/min — ABNORMAL LOW (ref >90)
GFR calc non Af Amer: 39 mL/min — ABNORMAL LOW (ref >90)
Glucose, Bld: 70 mg/dL (ref 70–99)
Potassium: 3.7 mmol/L (ref 3.5–5.1)
Sodium: 146 mmol/L — ABNORMAL HIGH (ref 135–145)

## 2014-07-07 NOTE — Evaluation (Signed)
Occupational Therapy Evaluation Patient Details Name: [name] MRN: [id number] DOB: 07/08/1922 Today's Date: 07/07/2014    History of Present Illness Pt adm with acute heart failure. PMH - dementia, obesity   Clinical Impression   Pt sponge bathes, dresses and toilets independently at baseline.  He presents with balance deficits greater than his baseline due to edematous LEs, requiring supervision for safety, but no physical assist.  Pt does not require further OT or DME for ADL. Agree with HHPT recommendation.     Follow Up Recommendations  No OT follow up    Equipment Recommendations  None recommended by OT    Recommendations for Other Services       Precautions / Restrictions Precautions Precautions: Fall Restrictions Weight Bearing Restrictions: No      Mobility Bed Mobility               General bed mobility comments: pt in chair  Transfers Overall transfer level: Needs assistance Equipment used: Straight cane Transfers: Sit to/from Stand Sit to Stand: Supervision              Balance Overall balance assessment: Needs assistance Sitting-balance support: No upper extremity supported Sitting balance-Leahy Scale: Good     Standing balance support: No upper extremity supported Standing balance-Leahy Scale: Fair                              ADL Overall ADL's : At baseline                                       General ADL Comments: Pt with edematous LEs. Demonstrated ability to perform LB dressing with increased effort.     Vision                     Perception     Praxis      Pertinent Vitals/Pain Pain Assessment: No/denies pain     Hand Dominance Right   Extremity/Trunk Assessment Upper Extremity Assessment Upper Extremity Assessment: Overall WFL for tasks assessed   Lower Extremity Assessment Lower Extremity Assessment: Defer to PT evaluation       Communication  Communication Communication: No difficulties   Cognition Arousal/Alertness: Awake/alert Behavior During Therapy: WFL for tasks assessed/performed Overall Cognitive Status: History of cognitive impairments - at baseline       Memory: Decreased short-term memory             General Comments       Exercises       Shoulder Instructions      Home Living Family/patient expects to be discharged to:: Private residence Living Arrangements: Spouse/significant other Available Help at Discharge: Family;Available 24 hours/day Type of Home: House Home Access: Stairs to enter [employer] CorporationEntrance Stairs-Number of Steps: 2-4   Home Layout: One level     Bathroom Shower/Tub: Tub/shower unit (pt sponge bathes)   Bathroom Toilet: Handicapped height     Home Equipment: Cane - single point          Prior Functioning/Environment Level of Independence: Needs assistance  Gait / Transfers Assistance Needed: amb mod I with cane          OT Diagnosis:     OT Problem List:     OT Treatment/Interventions:      OT Goals(Current goals can be found in the care plan  section) Acute Rehab OT Goals Patient Stated Goal: home today  OT Frequency:     Barriers to D/C:            Co-evaluation              End of Session    Activity Tolerance: Patient tolerated treatment well Patient left: in chair;with call bell/phone within reach   Time: 1610-9604 OT Time Calculation (min): 14 min Charges:  OT General Charges $OT Visit: 1 Procedure OT Evaluation $Initial OT Evaluation Tier I: 1 Procedure G-Codes:    Evern Bio 07/07/2014, 10:51 AM  [phone]

## 2014-07-07 NOTE — Evaluation (Signed)
Physical Therapy Evaluation Patient Details Name: [name] MRN: [id number] DOB: Jul 10, 1922 Today's Date: 07/07/2014   History of Present Illness  Pt adm with acute heart failure. PMH - dementia, obesity  Clinical Impression  Pt doing well with mobility and no further acute PT needed.  Ready for dc home with wife and HHPT from PT standpoint.      Follow Up Recommendations Home health PT;Supervision/Assistance - 24 hour    Equipment Recommendations  None recommended by PT    Recommendations for Other Services       Precautions / Restrictions Precautions Precautions: Fall Restrictions Weight Bearing Restrictions: No      Mobility  Bed Mobility                  Transfers Overall transfer level: Needs assistance Equipment used: Straight cane Transfers: Sit to/from Stand Sit to Stand: Supervision            Ambulation/Gait Ambulation/Gait assistance: Supervision Ambulation Distance (Feet): 125 Feet Assistive device: Straight cane Gait Pattern/deviations: Step-through pattern;Decreased step length - right;Decreased step length - left;Wide base of support;Trunk flexed     General Gait Details: Slow pace with 1 standing rest break.  Stairs            Wheelchair Mobility    Modified Rankin (Stroke Patients Only)       Balance Overall balance assessment: Needs assistance Sitting-balance support: No upper extremity supported Sitting balance-Leahy Scale: Good     Standing balance support: No upper extremity supported Standing balance-Leahy Scale: Fair                               Pertinent Vitals/Pain Pain Assessment: No/denies pain    Home Living Family/patient expects to be discharged to:: Private residence Living Arrangements: Spouse/significant other Available Help at Discharge: Family;Available 24 hours/day Type of Home: House Home Access: Stairs to enter   [employer] of Steps: 2-4 Home Layout: One  level Home Equipment: Cane - single point      Prior Function Level of Independence: Needs assistance   Gait / Transfers Assistance Needed: amb mod I with cane           Hand Dominance        Extremity/Trunk Assessment   Upper Extremity Assessment: Defer to OT evaluation           Lower Extremity Assessment: Generalized weakness         Communication   Communication: No difficulties  Cognition Arousal/Alertness: Awake/alert Behavior During Therapy: WFL for tasks assessed/performed Overall Cognitive Status: History of cognitive impairments - at baseline       Memory: Decreased short-term memory              General Comments      Exercises        Assessment/Plan    PT Assessment All further PT needs can be met in the next venue of care  PT Diagnosis Difficulty walking;Generalized weakness   PT Problem List Decreased strength;Decreased activity tolerance;Decreased balance;Decreased mobility;Obesity  PT Treatment Interventions     PT Goals (Current goals can be found in the Care Plan section) Acute Rehab PT Goals PT Goal Formulation: All assessment and education complete, DC therapy    Frequency     Barriers to discharge        Co-evaluation               End of  Session   Activity Tolerance: Patient tolerated treatment well Patient left: in chair;with call bell/phone within reach;with family/visitor present Nurse Communication: Mobility status         Time: 3568-6168 PT Time Calculation (min) (ACUTE ONLY): 14 min   Charges:   PT Evaluation $Initial PT Evaluation Tier I: 1 Procedure     PT G Codes:        [name] 08-06-2014, 10:21 AM  Suanne Marker PT [phone]

## 2014-07-07 NOTE — Plan of Care (Signed)
Problem: Phase I Progression Outcomes Goal: EF % per last Echo/documented,Core Reminder form on chart Outcome: Completed/Met Date Met:  07/07/14 EF = 50-55% per ECHO performed on 07/05/14.

## 2014-07-07 NOTE — Discharge Summary (Signed)
Physician Discharge Summary  Patient ID: [name] MRN: [id number] DOB/AGE: Feb 06, 1923 81 y.o.  Admit date: 07/04/2014 Discharge date: 07/07/2014  Admission Diagnoses: Bilateral leg edema R/O CHF. Hypertension Obesity  Discharge Diagnoses:  Principal Problem: * Early acute diastolic heart failure *   Bilateral leg edema    Sinus node dysfunction due to beta blocker.   Paroxysmal slow atrial flutter   Morbid obesity   Hypertension   Thrombocytopenia   Neutropenia   Severe Tricuspid regurgitation   Senile dementia  Discharged Condition: fair  Hospital Course: 81 year old male with past medical history of hypertension, gastroesophageal reflux disease has progressive worsening of leg edema x 2 years and some shortness of breath x 1 week. No chest pain or fever or cough. His BNP was 187.7. LV function was preserved with 50-55 % EF. He had episodes of sinus node dysfunction and slow atrial flutter with metoprolol use. He may need permanent pacemaker if he has bradycardia or need for Beta blocker use.  He had episode of hypotension and hypothermia post lasix use requiring hydration and Bear Hugger warmer. Heparin was discontinued due to significant thrombocytopenia. He was not a candidate for cardiac interventions. He felt well and was able to ambulate. His wife is aware of his forgetfulness and need for near 24 hour supervision as nursing home is not an option for them now. He will be followed by me in 4 days with recheck on CBC, BMET and vital signs.  Consults: cardiology  Significant Diagnostic Studies: labs: Mildly elevated BNP to 187.7. Significant thrombocytopenia and neutropenia with near normal hemoglobin.   EKG showing sinus node dysfunction and slow atrial flutter.  Echocardiogram showed 50-55 % EF with severe TR.  Chest x-ray showed cardiomegaly and bilateral pleural effusions.  Treatments: Discontinuation of metoprolol and antihypertensive medications due to hypotension  post diuresis.  Discharge Exam: Blood pressure 116/64, pulse 102, temperature 96.3 F (35.7 C), temperature source Rectal, resp. rate 18, height  (1.753 m), weight 103.511 kg (228 lb 3.2 oz), SpO2 100 %. HEENT: [place]/AT, Eyes-Brown, PERL, EOMI, Conjunctiva-Pink, Sclera-Non-icteric Neck: No JVD, No bruit, Trachea midline. Lungs: Clear, Bilateral. Cardiac: Regular rhythm, normal S1 and S2, no S3. III/VI systolic murmur. Abdomen: Soft, non-tender. Extremities: 2 + edema present. No cyanosis. No clubbing. CNS: AxOx2, Cranial nerves grossly intact, moves all 4 extremities. Right handed. Skin: Warm and dry.  Disposition: 01-Home or Self Care     Medication List    STOP taking these medications        doxazosin 4 MG tablet  Commonly known as:  CARDURA     furosemide 20 MG tablet  Commonly known as:  LASIX     lisinopril 10 MG tablet  Commonly known as:  PRINIVIL,ZESTRIL     metoprolol 50 MG tablet  Commonly known as:  LOPRESSOR      TAKE these medications        MULTI COMPLETE PO  Take 1 tablet by mouth as needed (only takes soometimes).     omeprazole 20 MG capsule  Commonly known as:  PRILOSEC  Take 20 mg by mouth as needed (for heartburn).     REFRESH OP  Apply 2 drops to eye daily as needed (for itching).           Follow-up Information    Follow up with [facility], MD. Schedule an appointment as soon as possible for a visit in 1 week.   Specialty:  Cardiology   Contact information:  [address] [place] [address] [phone]       Signed: [name],Magnus Crescenzo S 07/07/2014, 9:19 AM

## 2014-08-09 DIAGNOSIS — Z6831 Body mass index (BMI) 31.0-31.9, adult: Secondary | ICD-10-CM

## 2014-08-09 DIAGNOSIS — K219 Gastro-esophageal reflux disease without esophagitis: Secondary | ICD-10-CM | POA: Diagnosis present

## 2014-08-09 DIAGNOSIS — E669 Obesity, unspecified: Secondary | ICD-10-CM | POA: Diagnosis present

## 2014-08-09 DIAGNOSIS — I4892 Unspecified atrial flutter: Secondary | ICD-10-CM | POA: Diagnosis present

## 2014-08-09 DIAGNOSIS — Z87891 Personal history of nicotine dependence: Secondary | ICD-10-CM

## 2014-08-09 DIAGNOSIS — F0390 Unspecified dementia without behavioral disturbance: Secondary | ICD-10-CM | POA: Diagnosis present

## 2014-08-09 DIAGNOSIS — E876 Hypokalemia: Secondary | ICD-10-CM | POA: Diagnosis not present

## 2014-08-09 DIAGNOSIS — A419 Sepsis, unspecified organism: Principal | ICD-10-CM | POA: Diagnosis present

## 2014-08-09 DIAGNOSIS — I5032 Chronic diastolic (congestive) heart failure: Secondary | ICD-10-CM | POA: Diagnosis present

## 2014-08-09 DIAGNOSIS — Z9119 Patient's noncompliance with other medical treatment and regimen: Secondary | ICD-10-CM | POA: Diagnosis present

## 2014-08-09 DIAGNOSIS — I4891 Unspecified atrial fibrillation: Secondary | ICD-10-CM | POA: Diagnosis present

## 2014-08-09 DIAGNOSIS — M79605 Pain in left leg: Secondary | ICD-10-CM | POA: Diagnosis not present

## 2014-08-09 DIAGNOSIS — I10 Essential (primary) hypertension: Secondary | ICD-10-CM | POA: Diagnosis present

## 2014-08-09 DIAGNOSIS — N390 Urinary tract infection, site not specified: Secondary | ICD-10-CM | POA: Diagnosis present

## 2014-08-09 NOTE — ED Notes (Addendum)
Pt c/o new boil to left leg. States that he has a hx of these boils and they sometimes have to be lanced. States that his leg swelling in chronic and they have not changed in size.

## 2014-08-10 ENCOUNTER — Inpatient Hospital Stay (HOSPITAL_COMMUNITY)
Admission: EM | Admit: 2014-08-10 | Discharge: 2014-08-14 | DRG: 872 | Disposition: A | Discharge status: Home / Self Care | Payer: Medicare HMO | Attending: Cardiovascular Disease | Admitting: Cardiovascular Disease

## 2014-08-10 ENCOUNTER — Encounter (HOSPITAL_COMMUNITY): Payer: Self-pay | Admitting: *Deleted

## 2014-08-10 ENCOUNTER — Emergency Department (HOSPITAL_COMMUNITY): Payer: Medicare HMO

## 2014-08-10 DIAGNOSIS — N390 Urinary tract infection, site not specified: Secondary | ICD-10-CM | POA: Diagnosis present

## 2014-08-10 DIAGNOSIS — F0390 Unspecified dementia without behavioral disturbance: Secondary | ICD-10-CM | POA: Diagnosis present

## 2014-08-10 DIAGNOSIS — Z6831 Body mass index (BMI) 31.0-31.9, adult: Secondary | ICD-10-CM | POA: Diagnosis not present

## 2014-08-10 DIAGNOSIS — A419 Sepsis, unspecified organism: Secondary | ICD-10-CM | POA: Diagnosis present

## 2014-08-10 DIAGNOSIS — T68XXXA Hypothermia, initial encounter: Secondary | ICD-10-CM

## 2014-08-10 DIAGNOSIS — I959 Hypotension, unspecified: Secondary | ICD-10-CM

## 2014-08-10 DIAGNOSIS — E876 Hypokalemia: Secondary | ICD-10-CM | POA: Diagnosis not present

## 2014-08-10 DIAGNOSIS — M79605 Pain in left leg: Secondary | ICD-10-CM | POA: Diagnosis present

## 2014-08-10 DIAGNOSIS — I4891 Unspecified atrial fibrillation: Secondary | ICD-10-CM | POA: Diagnosis present

## 2014-08-10 DIAGNOSIS — Z9119 Patient's noncompliance with other medical treatment and regimen: Secondary | ICD-10-CM | POA: Diagnosis present

## 2014-08-10 DIAGNOSIS — Z87891 Personal history of nicotine dependence: Secondary | ICD-10-CM | POA: Diagnosis not present

## 2014-08-10 DIAGNOSIS — E669 Obesity, unspecified: Secondary | ICD-10-CM | POA: Diagnosis present

## 2014-08-10 DIAGNOSIS — K219 Gastro-esophageal reflux disease without esophagitis: Secondary | ICD-10-CM | POA: Diagnosis present

## 2014-08-10 DIAGNOSIS — I10 Essential (primary) hypertension: Secondary | ICD-10-CM | POA: Diagnosis present

## 2014-08-10 DIAGNOSIS — I4892 Unspecified atrial flutter: Secondary | ICD-10-CM | POA: Diagnosis present

## 2014-08-10 DIAGNOSIS — I5032 Chronic diastolic (congestive) heart failure: Secondary | ICD-10-CM | POA: Diagnosis present

## 2014-08-10 LAB — TSH: TSH: 3.419 u[IU]/mL (ref 0.350–4.500)

## 2014-08-10 LAB — CBC WITH DIFFERENTIAL/PLATELET
Basophils Absolute: 0 10*3/uL (ref 0.0–0.1)
Basophils Relative: 0 % (ref 0–1)
Eosinophils Absolute: 0 10*3/uL (ref 0.0–0.7)
Eosinophils Relative: 1 % (ref 0–5)
HEMATOCRIT: 36.3 % — AB (ref 39.0–52.0)
HEMOGLOBIN: 13.1 g/dL (ref 13.0–17.0)
LYMPHS ABS: 0.8 10*3/uL (ref 0.7–4.0)
Lymphocytes Relative: 28 % (ref 12–46)
MCH: 28.9 pg (ref 26.0–34.0)
MCHC: 36.1 g/dL — AB (ref 30.0–36.0)
MCV: 80 fL (ref 78.0–100.0)
MONOS PCT: 10 % (ref 3–12)
Monocytes Absolute: 0.3 10*3/uL (ref 0.1–1.0)
NEUTROS ABS: 1.7 10*3/uL (ref 1.7–7.7)
Neutrophils Relative %: 60 % (ref 43–77)
PLATELETS: 62 10*3/uL — AB (ref 150–400)
RBC: 4.54 MIL/uL (ref 4.22–5.81)
RDW: 17.9 % — AB (ref 11.5–15.5)
WBC: 2.8 10*3/uL — AB (ref 4.0–10.5)

## 2014-08-10 LAB — URINE MICROSCOPIC-ADD ON

## 2014-08-10 LAB — URINALYSIS, ROUTINE W REFLEX MICROSCOPIC
Glucose, UA: NEGATIVE mg/dL
Ketones, ur: 15 mg/dL — AB
Nitrite: NEGATIVE
PH: 5 (ref 5.0–8.0)
Protein, ur: 30 mg/dL — AB
Specific Gravity, Urine: 1.023 (ref 1.005–1.030)
UROBILINOGEN UA: 1 mg/dL (ref 0.0–1.0)

## 2014-08-10 LAB — BASIC METABOLIC PANEL
ANION GAP: 4 — AB (ref 5–15)
BUN: 21 mg/dL (ref 6–23)
CO2: 31 mmol/L (ref 19–32)
CREATININE: 1.23 mg/dL (ref 0.50–1.35)
Calcium: 9.7 mg/dL (ref 8.4–10.5)
Chloride: 106 mmol/L (ref 96–112)
GFR calc Af Amer: 58 mL/min — ABNORMAL LOW (ref >90)
GFR, EST NON AFRICAN AMERICAN: 50 mL/min — AB (ref >90)
Glucose, Bld: 134 mg/dL — ABNORMAL HIGH (ref 70–99)
Potassium: 4 mmol/L (ref 3.5–5.1)
Sodium: 141 mmol/L (ref 135–145)

## 2014-08-10 LAB — TROPONIN I

## 2014-08-10 LAB — I-STAT CG4 LACTIC ACID, ED
LACTIC ACID, VENOUS: 1.99 mmol/L (ref 0.5–2.0)
Lactic Acid, Venous: 1.08 mmol/L (ref 0.5–2.0)

## 2014-08-10 LAB — MRSA PCR SCREENING: MRSA BY PCR: NEGATIVE

## 2014-08-10 LAB — BRAIN NATRIURETIC PEPTIDE: B Natriuretic Peptide: 189 pg/mL — ABNORMAL HIGH (ref 0.0–100.0)

## 2014-08-10 MED ORDER — SODIUM CHLORIDE 0.9 % IV BOLUS (SEPSIS)
500.0000 mL | Freq: Once | INTRAVENOUS | Status: AC
  Administered 2014-08-10: 500 mL via INTRAVENOUS

## 2014-08-10 MED ORDER — SODIUM CHLORIDE 0.9 % IV BOLUS (SEPSIS)
1000.0000 mL | Freq: Once | INTRAVENOUS | Status: AC
  Administered 2014-08-10: 1000 mL via INTRAVENOUS

## 2014-08-10 MED ORDER — ACETAMINOPHEN 325 MG PO TABS
650.0000 mg | ORAL_TABLET | Freq: Four times a day (QID) | ORAL | Status: DC | PRN
  Filled 2014-08-10: qty 2

## 2014-08-10 MED ORDER — SODIUM CHLORIDE 0.9 % IV SOLN
1500.0000 mg | Freq: Once | INTRAVENOUS | Status: AC
  Administered 2014-08-10: 1500 mg via INTRAVENOUS
  Filled 2014-08-10: qty 1500

## 2014-08-10 MED ORDER — HEPARIN SODIUM (PORCINE) 5000 UNIT/ML IJ SOLN
5000.0000 [IU] | Freq: Three times a day (TID) | INTRAMUSCULAR | Status: DC
  Administered 2014-08-11 – 2014-08-13 (×5): 5000 [IU] via SUBCUTANEOUS
  Filled 2014-08-10 (×10): qty 1

## 2014-08-10 MED ORDER — VANCOMYCIN HCL 10 G IV SOLR
1250.0000 mg | INTRAVENOUS | Status: DC
  Administered 2014-08-12: 1250 mg via INTRAVENOUS
  Filled 2014-08-10 (×3): qty 1250

## 2014-08-10 MED ORDER — ALUM & MAG HYDROXIDE-SIMETH 200-200-20 MG/5ML PO SUSP: 30.0000 mL | Freq: Four times a day (QID) | ORAL | Status: DC | PRN

## 2014-08-10 MED ORDER — FUROSEMIDE 10 MG/ML IJ SOLN: 40.0000 mg | Freq: Once | INTRAMUSCULAR | Status: DC

## 2014-08-10 MED ORDER — LIDOCAINE-EPINEPHRINE (PF) 2 %-1:200000 IJ SOLN: 10.0000 mL | Freq: Once | INTRAMUSCULAR | Status: DC

## 2014-08-10 MED ORDER — SODIUM CHLORIDE 0.9 % IJ SOLN
3.0000 mL | Freq: Two times a day (BID) | INTRAMUSCULAR | Status: DC
Start: 2014-08-10 — End: 2014-08-14
  Administered 2014-08-10 – 2014-08-13 (×6): 3 mL via INTRAVENOUS

## 2014-08-10 MED ORDER — PIPERACILLIN-TAZOBACTAM 3.375 G IVPB
3.3750 g | INTRAVENOUS | Status: AC
  Administered 2014-08-10: 3.375 g via INTRAVENOUS
  Filled 2014-08-10: qty 50

## 2014-08-10 MED ORDER — ADULT MULTIVITAMIN W/MINERALS CH
1.0000 | ORAL_TABLET | Freq: Every day | ORAL | Status: DC
  Administered 2014-08-10 – 2014-08-12 (×3): 1 via ORAL
  Filled 2014-08-10 (×5): qty 1

## 2014-08-10 MED ORDER — SODIUM CHLORIDE 0.9 % IV SOLN
INTRAVENOUS | Status: DC
  Administered 2014-08-10: 05:00:00 via INTRAVENOUS

## 2014-08-10 MED ORDER — PIPERACILLIN-TAZOBACTAM 3.375 G IVPB
3.3750 g | Freq: Three times a day (TID) | INTRAVENOUS | Status: DC
  Administered 2014-08-10 – 2014-08-12 (×5): 3.375 g via INTRAVENOUS
  Filled 2014-08-10 (×9): qty 50

## 2014-08-10 MED ORDER — ACETAMINOPHEN 650 MG RE SUPP
650.0000 mg | Freq: Four times a day (QID) | RECTAL | Status: DC | PRN
Start: 2014-08-10 — End: 2014-08-14

## 2014-08-10 MED ORDER — POLYVINYL ALCOHOL 1.4 % OP SOLN
1.0000 [drp] | Freq: Two times a day (BID) | OPHTHALMIC | Status: DC
  Administered 2014-08-12: 1 [drp] via OPHTHALMIC
  Filled 2014-08-10: qty 15

## 2014-08-10 MED ORDER — FUROSEMIDE 10 MG/ML IJ SOLN: 20.0000 mg | Freq: Once | INTRAMUSCULAR | Status: DC

## 2014-08-10 MED ORDER — DOCUSATE SODIUM 100 MG PO CAPS
100.0000 mg | ORAL_CAPSULE | Freq: Two times a day (BID) | ORAL | Status: DC
  Administered 2014-08-10 – 2014-08-12 (×6): 100 mg via ORAL
  Filled 2014-08-10 (×10): qty 1

## 2014-08-10 MED ORDER — ONDANSETRON HCL 4 MG PO TABS: 4.0000 mg | ORAL_TABLET | Freq: Four times a day (QID) | ORAL | Status: DC | PRN

## 2014-08-10 MED ORDER — ONDANSETRON HCL 4 MG/2ML IJ SOLN: 4.0000 mg | Freq: Four times a day (QID) | INTRAMUSCULAR | Status: DC | PRN

## 2014-08-10 MED ORDER — PANTOPRAZOLE SODIUM 40 MG PO TBEC
40.0000 mg | DELAYED_RELEASE_TABLET | Freq: Every day | ORAL | Status: DC
  Administered 2014-08-10 – 2014-08-12 (×3): 40 mg via ORAL
  Filled 2014-08-10 (×4): qty 1

## 2014-08-10 NOTE — Progress Notes (Signed)
[facility] Awaiting pt arrival to unit.

## 2014-08-10 NOTE — ED Notes (Signed)
Spoke with MD Ward regarding upgrading patient to level 1 sepsis due to MAP <65 after fluid bolus. Stated she did not want to make patient level 1 sepsis because the patient has chronic hypotension and this is not very far from his normal. Pt also remains hypothermic. AD Zach and rapid response RN notified and discussed this decision. Agreement that if patient BP continues to trend upward than we can wait on activating level 1, if BP lowers again then level 1 sepsis would need to be initiated.

## 2014-08-10 NOTE — ED Notes (Signed)
Called main lab, spoke to kim in regards to bnp results. Results now available.

## 2014-08-10 NOTE — ED Notes (Signed)
Paged Dr Kadakia.  

## 2014-08-10 NOTE — ED Notes (Signed)
Dr. Elesa MassedWard aware of bp in the 80's systolic, temp of 93.5 rectal. She comes to see patient and updates family on plan of care. Portable chest x-ray at the bedside.

## 2014-08-10 NOTE — ED Notes (Signed)
Spoke with Rapid Response and she stated that pt's blood pressure was at baseline, pt still receiving 2nd liter of fluid and MD notified of 83/49 blood pressure. Pt is alert and oriented.

## 2014-08-10 NOTE — ED Notes (Signed)
Dr. Elesa MassedWard reviews case, no need to upgrade to level 1 code sepsis at this time.

## 2014-08-10 NOTE — ED Notes (Addendum)
Verified with Dr. Elesa MassedWard that she does not want pt getting 3730ml/kg of fluids as with normal sepsis order sets. Due to pts CHF, Dr. Elesa MassedWard, advised to give another 500ml bolus at this time.

## 2014-08-10 NOTE — ED Notes (Signed)
Bair hugger removed from pt. Pt stated that he did not want it on anymore and temperature is back to normal. Continuing to monitor temperature and bair hugger at bedside.

## 2014-08-10 NOTE — Progress Notes (Signed)
ANTIBIOTIC CONSULT NOTE - INITIAL  Pharmacy Consult for vancomycin and zosyn Indication:Sepsis   No Known Allergies  Patient Measurements: Height: 5\' 11"  (180.3 cm) Weight: 227 lb (102.967 kg) IBW/kg (Calculated) : 75.3   Vital Signs: Temp: 93.5 F (34.2 C) (03/10 0106) Temp Source: Rectal (03/10 0106) BP: 96/70 mmHg (03/10 0115) Pulse Rate: 94 (03/10 0115) Intake/Output from previous day:   Intake/Output from this shift:    Labs:  Recent Labs  08/10/14 0032  WBC 2.8*  HGB 13.1  PLT PENDING  CREATININE 1.23   Estimated Creatinine Clearance: 49.8 mL/min (by C-G formula based on Cr of 1.23). No results for input(s): VANCOTROUGH, VANCOPEAK, VANCORANDOM, GENTTROUGH, GENTPEAK, GENTRANDOM, TOBRATROUGH, TOBRAPEAK, TOBRARND, AMIKACINPEAK, AMIKACINTROU, AMIKACIN in the last 72 hours.   Microbiology: No results found for this or any previous visit (from the past 720 hour(s)).  Medical History: Past Medical History  Diagnosis Date  . Hypertension   . GERD (gastroesophageal reflux disease)   . Dry eye   . CHF (congestive heart failure)     Assessment: 81 y.o male c/o worsening blister to LLE in the last 24 hours. Patient reports h/o same and notes having I&D in the past.  WBC 2.8K . Patient was noted to be hypotensive upon recheck with a blood pressure of 85/65 (this is prior to receiving Lasix) and rectal temperature showed hypothermia 93.5 F.  Blood cultures and urine cultures ordered.    Goal of Therapy:  Vancomycin trough level 15-20 mcg/ml  Plan:  Zosyn 3.375 gm IV q8h (4h infusion) Vancomycin 1500 mg IV x1 then 1250 mg IV q24h Monitor clinical status, renal function, culture results daily.  Thank you for allowing pharmacy to be part of this patients care team. Noah Delaineuth Tannor Pyon, RPh Clinical Pharmacist Pager: [phone] 08/10/2014,1:28 AM

## 2014-08-10 NOTE — ED Notes (Signed)
Provided update to Dr. Elesa MassedWard, bp 92/63, bnp of 189. MD acknowledges.

## 2014-08-10 NOTE — ED Notes (Signed)
Patient remains under bair hugger, more warm blankets provided.

## 2014-08-10 NOTE — ED Notes (Signed)
Patient in trendelenberg

## 2014-08-10 NOTE — ED Notes (Signed)
bair hugger applied.

## 2014-08-10 NOTE — Progress Notes (Signed)
Pt arrived to unit accompanied by Al[facility] DameErika RN. Pt oriented to room/unit. VS stable. No s/s of acute distress noted. Wife at bedside.

## 2014-08-10 NOTE — ED Notes (Signed)
Spoke to Dr. Elesa MassedWard in regards to patient plan of care, bp still 80's, 2nd IV bolus ordered for management.  EKG ordered in regards to afib heart rate that drops to 40s.

## 2014-08-10 NOTE — ED Notes (Signed)
Dr Algie CofferKadakia paged to change pt to stepdown.

## 2014-08-10 NOTE — H&P (Signed)
Referring Physician:  EVERETTE Kaufman is an 81 y.o. male.                       Chief Complaint: Left lower extremity blister and weakness with hypothermia  HPI: [name] is a 81 y.o. male with history of HTN, GERD, and CHF, who presents to the Emergency Department complaining of a worsening blister to the LLE in the last 24 hours. Patient states he can no longer wear his compression stocking comfortably due to increased swelling in the lower extremities. He reports history of the same and notes having them I&D'ed in the past. There is associated BLE pain. Patient notes BLE edema that is chronic, however, he states this has gotten worse slowly over the past several weeks. He is on Lasix 20 mg Monday and Thursday. He denies any new chest pain or SOB. Denies fever. His blood pressure was in 80 systolic and temp of 74.9 rectal. Bear hugger applied.  Past Medical History  Diagnosis Date  . Hypertension   . GERD (gastroesophageal reflux disease)   . Dry eye   . CHF (congestive heart failure)       Past Surgical History  Procedure Laterality Date  . Cardiac surgery  2008    pt states he had agrowth removed from his heart  . Facial growth      No family history on file. Social History:  reports that he has quit smoking. He has never used smokeless tobacco. He reports that he does not drink alcohol or use illicit drugs.  Allergies: No Known Allergies   (Not in a hospital admission)  Results for orders placed or performed during the hospital encounter of 08/10/14 (from the past 48 hour(s))  CBC with Differential     Status: Abnormal   Collection Time: 08/10/14 12:32 AM  Result Value Ref Range   WBC 2.8 (L) 4.0 - 10.5 K/uL   RBC 4.54 4.22 - 5.81 MIL/uL   Hemoglobin 13.1 13.0 - 17.0 g/dL   HCT 36.3 (L) 39.0 - 52.0 %   MCV 80.0 78.0 - 100.0 fL   MCH 28.9 26.0 - 34.0 pg   MCHC 36.1 (H) 30.0 - 36.0 g/dL   RDW 17.9 (H) 11.5 - 15.5 %   Platelets 62 (L) 150 - 400 K/uL    Comment:  PLATELET COUNT CONFIRMED BY SMEAR SPECIMEN CHECKED FOR CLOTS REPEATED TO VERIFY    Neutrophils Relative % 60 43 - 77 %   Neutro Abs 1.7 1.7 - 7.7 K/uL   Lymphocytes Relative 28 12 - 46 %   Lymphs Abs 0.8 0.7 - 4.0 K/uL   Monocytes Relative 10 3 - 12 %   Monocytes Absolute 0.3 0.1 - 1.0 K/uL   Eosinophils Relative 1 0 - 5 %   Eosinophils Absolute 0.0 0.0 - 0.7 K/uL   Basophils Relative 0 0 - 1 %   Basophils Absolute 0.0 0.0 - 0.1 K/uL  Basic metabolic panel     Status: Abnormal   Collection Time: 08/10/14 12:32 AM  Result Value Ref Range   Sodium 141 135 - 145 mmol/L   Potassium 4.0 3.5 - 5.1 mmol/L   Chloride 106 96 - 112 mmol/L   CO2 31 19 - 32 mmol/L   Glucose, Bld 134 (H) 70 - 99 mg/dL   BUN 21 6 - 23 mg/dL   Creatinine, Ser 1.23 0.50 - 1.35 mg/dL   Calcium 9.7 8.4 - 10.5 mg/dL  GFR calc non Af Amer 50 (L) >90 mL/min   GFR calc Af Amer 58 (L) >90 mL/min    Comment: (NOTE) The eGFR has been calculated using the CKD EPI equation. This calculation has not been validated in all clinical situations. eGFR's persistently <90 mL/min signify possible Chronic Kidney Disease.    Anion gap 4 (L) 5 - 15  Troponin I     Status: None   Collection Time: 08/10/14 12:51 AM  Result Value Ref Range   Troponin I <0.03 <0.031 ng/mL    Comment:        NO INDICATION OF MYOCARDIAL INJURY.   I-Stat CG4 Lactic Acid, ED     Status: None   Collection Time: 08/10/14  1:30 AM  Result Value Ref Range   Lactic Acid, Venous 1.99 0.5 - 2.0 mmol/L  Urinalysis, Routine w reflex microscopic     Status: Abnormal   Collection Time: 08/10/14  1:38 AM  Result Value Ref Range   Color, Urine RED (A) YELLOW    Comment: BIOCHEMICALS MAY BE AFFECTED BY COLOR   APPearance CLOUDY (A) CLEAR   Specific Gravity, Urine 1.023 1.005 - 1.030   pH 5.0 5.0 - 8.0   Glucose, UA NEGATIVE NEGATIVE mg/dL   Hgb urine dipstick LARGE (A) NEGATIVE   Bilirubin Urine SMALL (A) NEGATIVE   Ketones, ur 15 (A) NEGATIVE mg/dL    Protein, ur 30 (A) NEGATIVE mg/dL   Urobilinogen, UA 1.0 0.0 - 1.0 mg/dL   Nitrite NEGATIVE NEGATIVE   Leukocytes, UA SMALL (A) NEGATIVE  Urine microscopic-add on     Status: None   Collection Time: 08/10/14  1:38 AM  Result Value Ref Range   Squamous Epithelial / LPF RARE RARE   WBC, UA 3-6 <3 WBC/hpf   RBC / HPF TOO NUMEROUS TO COUNT <3 RBC/hpf   Bacteria, UA RARE RARE   Dg Chest Port 1 View  08/10/2014   CLINICAL DATA:  Hypothermia.  History of hypertension and CHF.  EXAM: PORTABLE CHEST - 1 VIEW  COMPARISON:  Frontal and lateral views 07/05/2014  FINDINGS: Patient is post median sternotomy. The heart is enlarged. Lung volumes are low. Probable bilateral small pleural effusions, similar to prior exam. There is no pulmonary edema. No confluent airspace disease. No pneumothorax.  IMPRESSION: Hypoventilatory chest with grossly stable cardiomegaly and likely small bilateral pleural effusions. No significant change compared to prior exam allowing for differences in technique.   Electronically Signed   By: Jeb Levering M.D.   On: 08/10/2014 01:30    Review Of Systems + wears glasses, + weight gain + partial dentures, + COPD, + angina, + leg edema, + hypertension, + DM, II, + CHF, No CVA, No seizures, No kidney stone, + arthritis.  Blood pressure 100/62, pulse 95, temperature 93.5 F (34.2 C), temperature source Rectal, resp. rate 17, height 5' 11" (1.803 m), weight 102.967 kg (227 lb), SpO2 98 %.  General: Well built and overnourished in no respiratory distress. HEENT: [place]/AT, brown eyes, PERL, EOMI. conjuntiva-pink, sclera-white. Neck: + JVD at 0 degree angle, no bruit, no thyromegaly.  Lungs: Rare basal crackles to auscultation.  Heart: Normal S1 and S2. Grade III/VI systolic murmur LSB  Abdomen: Mild swelling, non-tender.  Ext: 2 + edema up to thighs, bilaterally.  CNS: Cranial nerves grossly intact. Moves all 4 extremities. Skin: Warm and dry.  Assessment/Plan Sepsis from  UTI Bilateral leg edema R/O CHF. Hypertension Obesity  Agree with IV fluids/Bear hugger/Hold home antihypertensives. I  and D of blister using sterile 20 G needle and dressing applied. IV antibiotics.  Birdie Riddle, MD  08/10/2014, 3:31 AM

## 2014-08-10 NOTE — ED Provider Notes (Signed)
This chart was scribed for Layla Maw Masao Junker, DO by Bronson Curb, ED Scribe. This patient was seen in room A08C/A08C and the patient's care was started at 12:17 AM.  TIME SEEN: 0017  CHIEF COMPLAINT: Leg Pain  HPI:   HPI Comments: [name] is a 81 y.o. male with history of HTN, GERD, and CHF, who presents to the Emergency Department complaining of a worsening blister to the LLE in the last 24 hours. Patient states he can no longer wear his compression stocking comfortably due to increased swelling in the lower extremities. He reports history of the same and notes having them I&D'ed in the past. There is associated BLE pain. Patient notes BLE edema that is chronic, however, he states this has gotten worse slowly over the past several weeks. He is on Lasix 20 mg Monday and Thursday.  He denies any new chest pain or SOB. Denies fever. Next appointment with cardiologist on March 22nd.  PCP: Algie Coffer  ROS: See HPI Constitutional: no fever  Eyes: no drainage  ENT: no runny nose   Cardiovascular:  no chest pain  Resp: no SOB  GI: no vomiting GU: no dysuria Integumentary: no rash  Allergy: no hives  Musculoskeletal: leg swelling  Neurological: no slurred speech ROS otherwise negative  PAST MEDICAL HISTORY/PAST SURGICAL HISTORY:  Past Medical History  Diagnosis Date  . Hypertension   . GERD (gastroesophageal reflux disease)   . Dry eye   . CHF (congestive heart failure)     MEDICATIONS:  Prior to Admission medications   Medication Sig Start Date End Date Taking? Authorizing Provider  Multiple Vitamins-Minerals (MULTI COMPLETE PO) Take 1 tablet by mouth as needed (only takes soometimes).     Historical Provider, MD  omeprazole (PRILOSEC) 20 MG capsule Take 20 mg by mouth as needed (for heartburn).     Historical Provider, MD  Polyvinyl Alcohol-Povidone (REFRESH OP) Apply 2 drops to eye daily as needed (for itching).     Historical Provider, MD    ALLERGIES:  No Known  Allergies  SOCIAL HISTORY:  History  Substance Use Topics  . Smoking status: Former Games developer  . Smokeless tobacco: Never Used  . Alcohol Use: No    FAMILY HISTORY: No family history on file.  EXAM:  Triage Vitals: BP 121/57 mmHg  Pulse 100  Temp(Src)   Resp 16  Ht  (1.803 m)  Wt 227 lb (102.967 kg)  BMI 31.67 kg/m2  SpO2 97%  CONSTITUTIONAL: Alert and oriented and responds appropriately to questions. Well-appearing; well-nourished, pleasant, smiling, nontoxic, no distress HEAD: Normocephalic EYES: Conjunctivae clear, PERRL ENT: normal nose; no rhinorrhea; moist mucous membranes; pharynx without lesions noted NECK: Supple, no meningismus, no LAD  CARD: Irregularly irregular; S1 and S2 appreciated; no murmurs, no clicks, no rubs, no gallops RESP: Normal chest excursion without splinting or tachypnea; breath sounds clear and equal bilaterally; no wheezes, no rhonchi, no rales,  ABD/GI: Normal bowel sounds; non-distended; soft, non-tender, no rebound, no guarding BACK:  The back appears normal and is non-tender to palpation, there is no CVA tenderness EXT: Normal ROM in all joints; non-tender to palpation; 3+ pitting edema to mid thigh and BLE; 2+ DP pulses bilaterally. normal capillary refill; no cyanosis    SKIN: Normal color for age and race; warm. 3x2cm fluid-filled blister to the lateral aspect of the LLE with no drainage and no surrounding erythema or warmth. NEURO: Moves all extremities equally PSYCH: The patient's mood and manner are appropriate. Grooming and  personal hygiene are appropriate.  MEDICAL DECISION MAKING: Patient here with progressively worsening bilateral lower extremity. No chest pain or shortness of breath. Lungs are clear with good aeration and no hypoxia. He has a blister to the lateral left leg secondary to peripheral edema but is neurovascularly intact distally and has no sign of abscess or infection. He is requesting that we lanced this blisters that  he can comfortably wear his compression hose. Will give IV Lasix. BP 121/57.  Have advised him to start taking Lasix daily. He has an appointment with his cardiologist in less than 2 weeks. We'll check basic labs.  ED PROGRESS: Patient was noted to be hypotensive upon recheck with a blood pressure of 85/65 (this is prior to receiving Lasix)  and rectal temperature showed hypothermia 93.5. Will place pt on Bair hugger palpation and obtain urine, urine culture, blood cultures, lactate, chest x-ray. I will hold Lasix and give patient gentle IV hydration. Will also give broad-spectrum antibiotics given patient meets SIRS criteria.  He will need admission.   2:30 AM  Patient continues to have blood pressures in the 80s. He still has no complaints. Urine shows hemoglobin leukocytes. Culture is pending. This may be secondary to urinary tract infection. He does have leukopenia which she had in February. Lactate is normal. Discussed with Dr. Algie CofferKadakia who will see the patient in the emergency department. He is receiving his second 500 mL IV fluid bolus. We'll give a second liter of IV fluid if blood pressure continues to be low. He does appear on review of patient's records he has had systolic blood pressures in the upper 80s, low 90s multiple times in the past.   3:00 AM  Pt's last blood pressure was 100/67. Rectal temperature is not improving. HR fluctuates in 40s-90s but appears to be in atrial flutter which he has had in the past.  Suspect monitor not picking up HR accurately because of his arrhythmia. EKG shows no ischemic changes and he has a negative troponin. Will continue to keep patient under the Bair hugger and wall place warm blankets on the patient. He is still mentating normally without complaints.    CRITICAL CARE Performed by: Raelyn NumberWARD, Salma Walrond N   Total critical care time: 45 minutes  Critical care time was exclusive of separately billable procedures and treating other patients.  Critical care  was necessary to treat or prevent imminent or life-threatening deterioration.  Critical care was time spent personally by me on the following activities: development of treatment plan with patient and/or surrogate as well as nursing, discussions with consultants, evaluation of patient's response to treatment, examination of patient, obtaining history from patient or surrogate, ordering and performing treatments and interventions, ordering and review of laboratory studies, ordering and review of radiographic studies, pulse oximetry and re-evaluation of patient's condition.      EKG Interpretation  Date/Time:  Thursday August 10 2014 02:43:33 EST Ventricular Rate:  96 PR Interval:  200 QRS Duration: 103 QT Interval:  410 QTC Calculation: 518 R Axis:   -10 Text Interpretation:  A flutter Low voltage, extremity leads Borderline ST depression, inferior leads Prolonged QT interval Artifact No significant change since last tracing Confirmed by Deah Ottaway,  DO, Hayzel Ruberg ([id number]) on 08/10/2014 3:00:28 AM        I personally performed the services described in this documentation, which was scribed in my presence. The recorded information has been reviewed and is accurate.    Layla MawKristen N Brion Sossamon, DO 08/10/14 [phone]

## 2014-08-10 NOTE — ED Notes (Signed)
Called to give report to [facility] and the RN stated that [facility] cannot take a patient on a bear hugger. Called bed control and Sharita told me she would call me back with more information on placement.

## 2014-08-10 NOTE — Progress Notes (Addendum)
Call received earlier at 0220 from Assistant Director Zach RN with concerns of making pt level I sepsis due to his low BP 74/47. Per Zach 80-90 systolic is patient's baseline. I encouraged Zach to infuse the bolus fluids as ordered and reassess in an hour if the patient sBP had responded. I did voice concerns about patients rectal temp and advised close monitoring of that. At 0413 I received a call [facility] pt sepsis and he also voiced concerns about the patients soft BP. I passed along that Ian MalkinZach told me that his baseline was in the 80-90s systolic. Again I voiced concern over his very low rectal temp. I have not assessed this patient and I have only reviewed the chart and spoke with Timothy LassoZack and Caryn BeeKevin RN regarding this pt and  his VS.   At [phone] I received a call from [facility] RN regarding admitting this patient to tele. From my understanding telemetry floors do not accept patients who require a bare hugger. I advised her to call the ED and have them reassess the patients admit status.

## 2014-08-10 NOTE — ED Notes (Signed)
Called main lab, troponin and bnp have already been reported for add on. Processing now.

## 2014-08-11 LAB — BASIC METABOLIC PANEL
Anion gap: 10 (ref 5–15)
BUN: 14 mg/dL (ref 6–23)
CO2: 22 mmol/L (ref 19–32)
CREATININE: 1.24 mg/dL (ref 0.50–1.35)
Calcium: 9.2 mg/dL (ref 8.4–10.5)
Chloride: 110 mmol/L (ref 96–112)
GFR calc non Af Amer: 50 mL/min — ABNORMAL LOW (ref >90)
GFR, EST AFRICAN AMERICAN: 58 mL/min — AB (ref >90)
Glucose, Bld: 92 mg/dL (ref 70–99)
Potassium: 4 mmol/L (ref 3.5–5.1)
Sodium: 142 mmol/L (ref 135–145)

## 2014-08-11 LAB — CBC
HEMATOCRIT: 37.7 % — AB (ref 39.0–52.0)
HEMOGLOBIN: 13.4 g/dL (ref 13.0–17.0)
MCH: 28.9 pg (ref 26.0–34.0)
MCHC: 35.5 g/dL (ref 30.0–36.0)
MCV: 81.3 fL (ref 78.0–100.0)
Platelets: 54 10*3/uL — ABNORMAL LOW (ref 150–400)
RBC: 4.64 MIL/uL (ref 4.22–5.81)
RDW: 18.3 % — ABNORMAL HIGH (ref 11.5–15.5)
WBC: 2.9 10*3/uL — AB (ref 4.0–10.5)

## 2014-08-11 NOTE — Progress Notes (Signed)
Patient states he no longer wants us to "bother taking hid temperature"

## 2014-08-11 NOTE — Progress Notes (Signed)
Spoke with MD. Pt. Will be D/C'd tomorrow.

## 2014-08-11 NOTE — Progress Notes (Signed)
Called into room by Patient states he doesn't want tio be hooked up to any IV's and he had pulled out his IV catheter, which was intact. Told Patient that it was important to get his IV antibiotics but patient states he wanted to talk to doctor before he gets anything else. Refusing Vancomycin

## 2014-08-11 NOTE — Progress Notes (Signed)
Ref: Ricki Rodriguez, MD   Subjective:  Sitting up and talking. Expressed desire to go home but not against medical advise. T max 98 degrees.  Objective:  Vital Signs in the last 24 hours: Temp:  [97.3 F (36.3 C)-98 F (36.7 C)] 97.6 F (36.4 C) (03/10 1930) Pulse Rate:  [41-141] 100 (03/11 0754) Cardiac Rhythm:  [-] Atrial flutter (03/11 0800) Resp:  [14-28] 20 (03/11 0754) BP: (83-146)/(48-88) 146/88 mmHg (03/11 0351) SpO2:  [95 %-97 %] 97 % (03/11 0351) Weight:  [107.3 kg (236 lb 8.9 oz)] 107.3 kg (236 lb 8.9 oz) (03/10 1409)  Physical Exam: BP Readings from Last 1 Encounters:  08/11/14 146/88    Wt Readings from Last 1 Encounters:  08/10/14 107.3 kg (236 lb 8.9 oz)    Weight change: 4.334 kg (9 lb 8.9 oz)  HEENT: [place]/AT, Eyes-Brown, PERL, EOMI, Conjunctiva-Pink, Sclera-Non-icteric Neck: + JVD, No bruit, Trachea midline. Lungs:  Clear, Bilateral. Cardiac:  Regular rhythm, normal S1 and S2, no S3.  Abdomen:  Soft, non-tender. Extremities:  Massive edema of both lower legs and feet present. No cyanosis. No clubbing. CNS: AxOx2, Cranial nerves grossly intact, moves all 4 extremities. Right handed. Skin: Warm and dry.   Intake/Output from previous day: 03/10 0701 - 03/11 0700 In: 1293.3 [I.V.:1143.3; IV Piggyback:150] Out: 200 [Urine:200]    Lab Results: BMET    Component Value Date/Time   NA 142 08/11/2014 0231   NA 141 08/10/2014 0032   NA 146* 07/07/2014 0411   K 4.0 08/11/2014 0231   K 4.0 08/10/2014 0032   K 3.7 07/07/2014 0411   CL 110 08/11/2014 0231   CL 106 08/10/2014 0032   CL 108 07/07/2014 0411   CO2 22 08/11/2014 0231   CO2 31 08/10/2014 0032   CO2 34* 07/07/2014 0411   GLUCOSE 92 08/11/2014 0231   GLUCOSE 134* 08/10/2014 0032   GLUCOSE 70 07/07/2014 0411   BUN 14 08/11/2014 0231   BUN 21 08/10/2014 0032   BUN 24* 07/07/2014 0411   CREATININE 1.24 08/11/2014 0231   CREATININE 1.23 08/10/2014 0032   CREATININE 1.53* 07/07/2014 0411    CALCIUM 9.2 08/11/2014 0231   CALCIUM 9.7 08/10/2014 0032   CALCIUM 9.8 07/07/2014 0411   GFRNONAA 50* 08/11/2014 0231   GFRNONAA 50* 08/10/2014 0032   GFRNONAA 39* 07/07/2014 0411   GFRAA 58* 08/11/2014 0231   GFRAA 58* 08/10/2014 0032   GFRAA 45* 07/07/2014 0411   CBC    Component Value Date/Time   WBC 2.9* 08/11/2014 0231   RBC 4.64 08/11/2014 0231   HGB 13.4 08/11/2014 0231   HCT 37.7* 08/11/2014 0231   PLT 54* 08/11/2014 0231   MCV 81.3 08/11/2014 0231   MCH 28.9 08/11/2014 0231   MCHC 35.5 08/11/2014 0231   RDW 18.3* 08/11/2014 0231   LYMPHSABS 0.8 08/10/2014 0032   MONOABS 0.3 08/10/2014 0032   EOSABS 0.0 08/10/2014 0032   BASOSABS 0.0 08/10/2014 0032   HEPATIC Function Panel  Recent Labs  07/04/14 1530  PROT 7.3   HEMOGLOBIN A1C No components found for: HGA1C,  MPG CARDIAC ENZYMES Lab Results  Component Value Date   CKTOTAL 63 09/16/2007   CKMB 2.4 09/16/2007   TROPONINI <0.03 08/10/2014   TROPONINI <0.03 07/05/2014   TROPONINI <0.03 07/04/2014   BNP No results for input(s): PROBNP in the last 8760 hours. TSH  Recent Labs  07/04/14 1530 08/10/14 1523  TSH 3.759 3.419   CHOLESTEROL No results for input(s): CHOL in  the last 8760 hours.  Scheduled Meds: . docusate sodium  100 mg Oral BID  . heparin  5,000 Units Subcutaneous 3 times per day  . lidocaine-EPINEPHrine  10 mL Intradermal Once  . multivitamin with minerals  1 tablet Oral Daily  . pantoprazole  40 mg Oral Daily  . piperacillin-tazobactam (ZOSYN)  IV  3.375 g Intravenous 3 times per day  . polyvinyl alcohol  1 drop Both Eyes BID  . sodium chloride  3 mL Intravenous Q12H  . vancomycin  1,250 mg Intravenous Q24H   Continuous Infusions: . sodium chloride 50 mL/hr at 08/10/14 0508   PRN Meds:.acetaminophen **OR** acetaminophen, alum & mag hydroxide-simeth, ondansetron **OR** ondansetron (ZOFRAN) IV  Assessment/Plan: Sepsis from UTI Bilateral leg edema R/O  CHF. Hypertension Obesity  Transfer to telemetry bed. Discontinue IV fluids. Increase activity.   LOS: 1 day    Orpah CobbAjay Nadim Malia  MD  08/11/2014, 8:43 AM

## 2014-08-11 NOTE — Progress Notes (Signed)
Unable to obtain a oral or axillary temp, Patient refusing rectal temp. Applied warming blanket, will reassess.

## 2014-08-11 NOTE — Progress Notes (Signed)
Case management has not heard back from Dr. Algie CofferKadakia so they advised me to continue with transfer. [facility] Will transfer pt after pt eats lunch.

## 2014-08-11 NOTE — Progress Notes (Signed)
Medicare Important Message given? YES (If response is "NO", the following Medicare IM given date fields will be blank) Date Medicare IM given:08/11/2014 Medicare IM given by: Jasmin Winberry 

## 2014-08-11 NOTE — Clinical Documentation Improvement (Signed)
"  CHF", "History of CHF" , "r/o CHF" documented in current chart.  After study, please document the acuity (chronic, acute, acute on chronic) and type of CHF (systolic, diastolic, combined systolic and diastolic) in your progress notes and carry over to the discharge summary.  Thank you, Doy MinceVangela Eliane Hammersmith, RN [phone] Clinical Documentation Specialist

## 2014-08-11 NOTE — Progress Notes (Addendum)
Pt. Arrived to the unit via wheelchair. Pt. Is alert and oriented with no signs of distress noted. Pt. Vitals appear stable with no skin issues noted. Bilateral leg edema +3. Pt. Is complaining of wanting to go home and taking lasix pills. Spoke with pt. MD and he stated that pt. Will be D/C'd in the morning and does not want him to restart his lasix just yet.  Pt. Ambulated from wheelchair to the bed and tolerated well. Educated pt. On use of staff numbers, room telephone and call bell. Call light within reach. Orders released. No further needs noted at this time.

## 2014-08-11 NOTE — Progress Notes (Signed)
Reassesed Pt still unable to obtain oral or axillary temp and Patient refusing rectal temp. And Patient then request that we take no more temperature.

## 2014-08-11 NOTE — Progress Notes (Signed)
Received orders to transfer pt to tele. Pt and family aware of transfer. VS stable, no s/s of acute distress noted. Pt still requesting to go home. Steward DroneBrenda with case management is going to reach out to Dr. Algie CofferKadakia regarding d/c pt vs transfer; awaiting call back from BrittonBrenda.

## 2014-08-11 NOTE — Progress Notes (Signed)
Patient states that he wants to go home, and doesn't want any more treatment. States, "all he want's to do is go home and sit on his front porch." Reassured patient and listened as he talked about his life. Will pass on to MD Patient's wishes.

## 2014-08-12 LAB — BASIC METABOLIC PANEL
ANION GAP: 6 (ref 5–15)
BUN: 12 mg/dL (ref 6–23)
CALCIUM: 9.2 mg/dL (ref 8.4–10.5)
CHLORIDE: 111 mmol/L (ref 96–112)
CO2: 26 mmol/L (ref 19–32)
Creatinine, Ser: 1.13 mg/dL (ref 0.50–1.35)
GFR calc Af Amer: 64 mL/min — ABNORMAL LOW (ref >90)
GFR calc non Af Amer: 56 mL/min — ABNORMAL LOW (ref >90)
Glucose, Bld: 75 mg/dL (ref 70–99)
POTASSIUM: 3.3 mmol/L — AB (ref 3.5–5.1)
SODIUM: 143 mmol/L (ref 135–145)

## 2014-08-12 MED ORDER — CARVEDILOL 3.125 MG PO TABS
3.1250 mg | ORAL_TABLET | Freq: Two times a day (BID) | ORAL | Status: DC
  Administered 2014-08-12: 3.125 mg via ORAL
  Filled 2014-08-12 (×6): qty 1

## 2014-08-12 MED ORDER — FUROSEMIDE 10 MG/ML IJ SOLN
40.0000 mg | Freq: Once | INTRAMUSCULAR | Status: AC
  Administered 2014-08-12: 40 mg via INTRAVENOUS
  Filled 2014-08-12: qty 4

## 2014-08-12 MED ORDER — POTASSIUM CHLORIDE ER 10 MEQ PO TBCR
10.0000 meq | EXTENDED_RELEASE_TABLET | Freq: Three times a day (TID) | ORAL | Status: DC
  Administered 2014-08-12 (×3): 10 meq via ORAL
  Filled 2014-08-12 (×9): qty 1

## 2014-08-12 MED ORDER — CIPROFLOXACIN HCL 500 MG PO TABS
500.0000 mg | ORAL_TABLET | Freq: Two times a day (BID) | ORAL | Status: DC
  Administered 2014-08-12 – 2014-08-13 (×3): 500 mg via ORAL
  Filled 2014-08-12 (×7): qty 1

## 2014-08-12 NOTE — Progress Notes (Signed)
Ref: [name],John Trejos Kaufman, [name]   Subjective:  Feeling better. Less confused at home per wife.  Objective:  Vital Signs in the last 24 hours: Pulse Rate:  [98-100] 100 (03/12 1350) Cardiac Rhythm:  [-] Atrial fibrillation;Sinus tachycardia (03/11 2030) Resp:  [15-18] 18 (03/12 1350) BP: (112-129)/(76-93) 112/82 mmHg (03/12 1350) SpO2:  [97 %-100 %] 100 % (03/12 1350)  Physical Exam: BP Readings from Last 1 Encounters:  08/12/14 112/82    Wt Readings from Last 1 Encounters:  08/10/14 107.3 kg (236 lb 8.9 oz)    Weight change:   HEENT: [place]/AT, Eyes-Brown, PERL, EOMI, Conjunctiva-Pink, Sclera-Non-icteric Neck: + JVD, No bruit, Trachea midline. Lungs:  Clear, Bilateral. Cardiac:  Regular rhythm, normal S1 and S2, no S3.  Abdomen:  Soft, non-tender. Extremities:  2 + edema up to thigh present. No cyanosis. No clubbing. CNS: AxOx3, Cranial nerves grossly intact, moves all 4 extremities. Right handed. Skin: Warm and dry.   Intake/Output from previous day: 03/11 0701 - 03/12 0700 In: 240 [P.O.:240] Out: 2 [Urine:1; Stool:1]    Lab Results: BMET    Component Value Date/Time   NA 143 08/12/2014 0552   NA 142 08/11/2014 0231   NA 141 08/10/2014 0032   K 3.3* 08/12/2014 0552   K 4.0 08/11/2014 0231   K 4.0 08/10/2014 0032   CL 111 08/12/2014 0552   CL 110 08/11/2014 0231   CL 106 08/10/2014 0032   CO2 26 08/12/2014 0552   CO2 22 08/11/2014 0231   CO2 31 08/10/2014 0032   GLUCOSE 75 08/12/2014 0552   GLUCOSE 92 08/11/2014 0231   GLUCOSE 134* 08/10/2014 0032   BUN 12 08/12/2014 0552   BUN 14 08/11/2014 0231   BUN 21 08/10/2014 0032   CREATININE 1.13 08/12/2014 0552   CREATININE 1.24 08/11/2014 0231   CREATININE 1.23 08/10/2014 0032   CALCIUM 9.2 08/12/2014 0552   CALCIUM 9.2 08/11/2014 0231   CALCIUM 9.7 08/10/2014 0032   GFRNONAA 56* 08/12/2014 0552   GFRNONAA 50* 08/11/2014 0231   GFRNONAA 50* 08/10/2014 0032   GFRAA 64* 08/12/2014 0552   GFRAA 58* 08/11/2014 0231   GFRAA 58* 08/10/2014 0032   CBC    Component Value Date/Time   WBC 2.9* 08/11/2014 0231   RBC 4.64 08/11/2014 0231   HGB 13.4 08/11/2014 0231   HCT 37.7* 08/11/2014 0231   PLT 54* 08/11/2014 0231   MCV 81.3 08/11/2014 0231   MCH 28.9 08/11/2014 0231   MCHC 35.5 08/11/2014 0231   RDW 18.3* 08/11/2014 0231   LYMPHSABS 0.8 08/10/2014 0032   MONOABS 0.3 08/10/2014 0032   EOSABS 0.0 08/10/2014 0032   BASOSABS 0.0 08/10/2014 0032   HEPATIC Function Panel  Recent Labs  07/04/14 1530  PROT 7.3   HEMOGLOBIN A1C No components found for: HGA1C,  MPG CARDIAC ENZYMES Lab Results  Component Value Date   CKTOTAL 63 09/16/2007   CKMB 2.4 09/16/2007   TROPONINI <0.03 08/10/2014   TROPONINI <0.03 07/05/2014   TROPONINI <0.03 07/04/2014   BNP No results for input(Kaufman): PROBNP in the last 8760 hours. TSH  Recent Labs  07/04/14 1530 08/10/14 1523  TSH 3.759 3.419   CHOLESTEROL No results for input(Kaufman): CHOL in the last 8760 hours.  Scheduled Meds: . carvedilol  3.125 mg Oral BID WC  . ciprofloxacin  500 mg Oral BID  . docusate sodium  100 mg Oral BID  . heparin  5,000 Units Subcutaneous 3 times per day  . multivitamin with minerals  1 tablet Oral Daily  . pantoprazole  40 mg Oral Daily  . polyvinyl alcohol  1 drop Both Eyes BID  . potassium chloride  10 mEq Oral TID  . sodium chloride  3 mL Intravenous Q12H   Continuous Infusions:  PRN Meds:.acetaminophen **OR** acetaminophen, alum & mag hydroxide-simeth, ondansetron **OR** ondansetron (ZOFRAN) IV  Assessment/Plan: Sepsis from UTI Bilateral leg edema Chronic diastolic heart failure Hypertension Obesity Senile dementia Hypokalemia  Change to oral antibiotic. Increase activity.    LOS: 2 days    Orpah Cobb  [name]  08/12/2014, 6:14 PM

## 2014-08-13 LAB — BASIC METABOLIC PANEL
ANION GAP: 8 (ref 5–15)
BUN: 12 mg/dL (ref 6–23)
CALCIUM: 9.3 mg/dL (ref 8.4–10.5)
CHLORIDE: 110 mmol/L (ref 96–112)
CO2: 25 mmol/L (ref 19–32)
Creatinine, Ser: 1.14 mg/dL (ref 0.50–1.35)
GFR calc non Af Amer: 55 mL/min — ABNORMAL LOW (ref >90)
GFR, EST AFRICAN AMERICAN: 64 mL/min — AB (ref >90)
Glucose, Bld: 74 mg/dL (ref 70–99)
Potassium: 3.8 mmol/L (ref 3.5–5.1)
Sodium: 143 mmol/L (ref 135–145)

## 2014-08-13 LAB — URINE CULTURE: Colony Count: 3000

## 2014-08-13 MED ORDER — DOXAZOSIN MESYLATE 4 MG PO TABS
4.0000 mg | ORAL_TABLET | Freq: Every day | ORAL | Status: DC
  Filled 2014-08-13 (×2): qty 1

## 2014-08-13 MED ORDER — NITROGLYCERIN 0.4 MG/HR TD PT24
0.4000 mg | MEDICATED_PATCH | Freq: Every day | TRANSDERMAL | Status: AC
  Administered 2014-08-13: 0.4 mg via TRANSDERMAL
  Filled 2014-08-13: qty 1

## 2014-08-13 MED ORDER — FUROSEMIDE 10 MG/ML IJ SOLN
40.0000 mg | Freq: Once | INTRAMUSCULAR | Status: AC
  Administered 2014-08-13: 40 mg via INTRAVENOUS
  Filled 2014-08-13: qty 4

## 2014-08-13 MED ORDER — LISINOPRIL 5 MG PO TABS
5.0000 mg | ORAL_TABLET | Freq: Every day | ORAL | Status: DC
  Filled 2014-08-13 (×2): qty 1

## 2014-08-13 NOTE — Progress Notes (Signed)
Pt has BP 153/104 mm of hg. Pt is refusing  to take his all meds . Pt was encourage to take his meds and its importance with failed attempts. Pt repeatedly says he is going home tomorrow and he does not want to take his med. On-call provider Dr Algie CofferKadakia notified and order received for 40 mg of IV lasix. Will monitor.

## 2014-08-13 NOTE — Progress Notes (Signed)
Ref: [name],Vola Beneke S, MD   Subjective:  Wants to go home. Currently against cardiac interventions-if pacemaker needed for tachy-brady syndrome. Afebrile on oral antibiotics. Good diuresis yesterday. Blood pressure elevated.  Objective:  Vital Signs in the last 24 hours: Temp:  [98.5 F (36.9 C)] 98.5 F (36.9 C) (03/12 2139) Pulse Rate:  [100-102] 102 (03/13 0754) Cardiac Rhythm:  [-] Sinus tachycardia (03/12 2300) Resp:  [18] 18 (03/13 0517) BP: (112-148)/(82-104) 148/104 mmHg (03/13 0754) SpO2:  [100 %] 100 % (03/13 0517)  Physical Exam: BP Readings from Last 1 Encounters:  08/13/14 148/104    Wt Readings from Last 1 Encounters:  08/10/14 107.3 kg (236 lb 8.9 oz)    Weight change:   HEENT: [place]/AT, Eyes-Brown, PERL, EOMI, Conjunctiva-Pink, Sclera-Non-icteric Neck: + JVD, No bruit, Trachea midline. Lungs:  Clear, Bilateral. Cardiac:  Regular rhythm, normal S1 and S2, no S3.  Abdomen:  Soft, non-tender. Extremities:  2 + edema present. No cyanosis. No clubbing. CNS: AxOx3, Cranial nerves grossly intact, moves all 4 extremities. Right handed. Skin: Warm and dry.   Intake/Output from previous day: 03/12 0701 - 03/13 0700 In: 720 [P.O.:720] Out: 2225 [Urine:2225]    Lab Results: BMET    Component Value Date/Time   NA 143 08/13/2014 0602   NA 143 08/12/2014 0552   NA 142 08/11/2014 0231   K 3.8 08/13/2014 0602   K 3.3* 08/12/2014 0552   K 4.0 08/11/2014 0231   CL 110 08/13/2014 0602   CL 111 08/12/2014 0552   CL 110 08/11/2014 0231   CO2 25 08/13/2014 0602   CO2 26 08/12/2014 0552   CO2 22 08/11/2014 0231   GLUCOSE 74 08/13/2014 0602   GLUCOSE 75 08/12/2014 0552   GLUCOSE 92 08/11/2014 0231   BUN 12 08/13/2014 0602   BUN 12 08/12/2014 0552   BUN 14 08/11/2014 0231   CREATININE 1.14 08/13/2014 0602   CREATININE 1.13 08/12/2014 0552   CREATININE 1.24 08/11/2014 0231   CALCIUM 9.3 08/13/2014 0602   CALCIUM 9.2 08/12/2014 0552   CALCIUM 9.2 08/11/2014 0231   GFRNONAA 55* 08/13/2014 0602   GFRNONAA 56* 08/12/2014 0552   GFRNONAA 50* 08/11/2014 0231   GFRAA 64* 08/13/2014 0602   GFRAA 64* 08/12/2014 0552   GFRAA 58* 08/11/2014 0231   CBC    Component Value Date/Time   WBC 2.9* 08/11/2014 0231   RBC 4.64 08/11/2014 0231   HGB 13.4 08/11/2014 0231   HCT 37.7* 08/11/2014 0231   PLT 54* 08/11/2014 0231   MCV 81.3 08/11/2014 0231   MCH 28.9 08/11/2014 0231   MCHC 35.5 08/11/2014 0231   RDW 18.3* 08/11/2014 0231   LYMPHSABS 0.8 08/10/2014 0032   MONOABS 0.3 08/10/2014 0032   EOSABS 0.0 08/10/2014 0032   BASOSABS 0.0 08/10/2014 0032   HEPATIC Function Panel  Recent Labs  07/04/14 1530  PROT 7.3   HEMOGLOBIN A1C No components found for: HGA1C,  MPG CARDIAC ENZYMES Lab Results  Component Value Date   CKTOTAL 63 09/16/2007   CKMB 2.4 09/16/2007   TROPONINI <0.03 08/10/2014   TROPONINI <0.03 07/05/2014   TROPONINI <0.03 07/04/2014   BNP No results for input(s): PROBNP in the last 8760 hours. TSH  Recent Labs  07/04/14 1530 08/10/14 1523  TSH 3.759 3.419   CHOLESTEROL No results for input(s): CHOL in the last 8760 hours.  Scheduled Meds: . carvedilol  3.125 mg Oral BID WC  . ciprofloxacin  500 mg Oral BID  . docusate sodium  100 mg Oral BID  . doxazosin  4 mg Oral Daily  . lisinopril  5 mg Oral Daily  . multivitamin with minerals  1 tablet Oral Daily  . pantoprazole  40 mg Oral Daily  . polyvinyl alcohol  1 drop Both Eyes BID  . potassium chloride  10 mEq Oral TID  . sodium chloride  3 mL Intravenous Q12H   Continuous Infusions:  PRN Meds:.acetaminophen **OR** acetaminophen, alum & mag hydroxide-simeth, ondansetron **OR** ondansetron (ZOFRAN) IV  Assessment/Plan: Sepsis from UTI Bilateral leg edema Chronic diastolic heart failure Hypertension Obesity Senile dementia Hypokalemia-resolved  Increase activity. Home soon.     LOS: 3 days    Orpah Cobb  MD  08/13/2014, 1:11 PM

## 2014-08-13 NOTE — Progress Notes (Signed)
MD notified that patient HR dropped to later 20s while in deep sleep, and that patient had a 2.56 sec pause.  The patient is arousable; denies any discomfort.  While awake HR increases to 80s-105.  No new orders given.  Will continue to monitor patient.

## 2014-08-13 NOTE — Progress Notes (Signed)
Multiple attempts made to encourage patient to take medications. Patient has refused almost all meds today.

## 2014-08-14 MED ORDER — POTASSIUM CHLORIDE ER 10 MEQ PO TBCR: 10.0000 meq | EXTENDED_RELEASE_TABLET | Freq: Two times a day (BID) | ORAL | Status: DC

## 2014-08-14 MED ORDER — CARVEDILOL 3.125 MG PO TABS: 3.1250 mg | ORAL_TABLET | Freq: Two times a day (BID) | ORAL | Status: AC

## 2014-08-14 MED ORDER — CIPROFLOXACIN HCL 500 MG PO TABS: 500.0000 mg | ORAL_TABLET | Freq: Two times a day (BID) | ORAL | Status: DC

## 2014-08-14 NOTE — Evaluation (Signed)
Physical Therapy Evaluation Patient Details Name: [name] MRN: [id number] DOB: September 15, 1922 Today's Date: 08/14/2014   History of Present Illness  Admitted with UTI and sepsis; also with considerable Bil LE edema; Noted history of senile dementia as well Past Medical History  Diagnosis Date  . Hypertension   . GERD (gastroesophageal reflux disease)   . Dry eye   . CHF (congestive heart failure)    Past Surgical History  Procedure Laterality Date  . Cardiac surgery  2008    pt states he had agrowth removed from his heart  . Facial growth       Clinical Impression  Pt admitted with above diagnosis. Pt currently with functional limitations due to the deficits listed below (see PT Problem List).  Pt will benefit from skilled PT to increase their independence and safety with mobility to allow discharge to the venue listed below.    He is perseverative re: going home (and stopping by the bank); is his wife HCPOA? If not, is it worht considering evaluating him for capacity?     Follow Up Recommendations Home health PT;Supervision/Assistance - 24 hour;Other (comment) (HHOT, HHRN, HHAide)  While pt is appropriate for post-acute therapy at SNF level, he will refuse; and often home is the most therapeutic place for folks with dementia (especially given he has not eaten while here) -- I would favor maximizing services available to [name] and his family at home;  He is perseverative re: going home (and stopping by the bank); is his wife HCPOA? If not, is it worht considering evaluating him for capacity?    Equipment Recommendations  Rolling walker with 5" wheels;3in1 (PT)    Recommendations for Other Services OT consult;Other (comment) (Given his medical history, dementia, and age, is it worth considering a Palliative Medicine Consult?)     Precautions / Restrictions Precautions Precautions: Fall      Mobility  Bed Mobility                  Transfers Overall transfer  level: Needs assistance Equipment used: 1 person hand held assist;Rolling walker (2 wheeled);Straight cane Transfers: Sit to/from Stand Sit to Stand: Min assist         General transfer comment: Heavily reliant on UEs for push to stand and control of descent; unsteady, and unfortunately with decr awareness of balance deficits  Ambulation/Gait Ambulation/Gait assistance: Min assist Ambulation Distance (Feet): 30 Feet Assistive device: Rolling walker (2 wheeled) Gait Pattern/deviations: Step-through pattern;Trunk flexed Gait velocity: quite slow   General Gait Details: Cues for posture, and especially RW proximity as he tends to have RW very far ahead of him, which increases his already high fall risk; had to physicall block leg of RW with my foot to keep it from going too far in front  Stairs            Wheelchair Mobility    Modified Rankin (Stroke Patients Only)       Balance Overall balance assessment: Needs assistance         Standing balance support: Bilateral upper extremity supported;Single extremity supported Standing balance-Leahy Scale: Poor                               Pertinent Vitals/Pain Pain Assessment: No/denies pain (reports stiffness from "sitting too long")    Home Living Family/patient expects to be discharged to:: Private residence Living Arrangements: Spouse/significant other;Children Available Help at Discharge: Family;Available  24 hours/day Type of Home: House Home Access: Stairs to enter (pt and wife state no steps to enter, but noted in previous admission it is documented he has 2-4 steps to enter)   Secretary/administratorntrance Stairs-Number of Steps: 2-4 Home Layout: One level Home Equipment: Cane - single point      Prior Function Level of Independence: Needs assistance   Gait / Transfers Assistance Needed: amb mod I with cane           Hand Dominance   Dominant Hand: Right    Extremity/Trunk Assessment   Upper Extremity  Assessment: Generalized weakness           Lower Extremity Assessment: Generalized weakness (and significant Bil LE edema)         Communication   Communication: No difficulties  Cognition Arousal/Alertness: Awake/alert Behavior During Therapy: Restless (insisting he needs to go to bank and go home) Overall Cognitive Status: Impaired/Different from baseline Area of Impairment: Safety/judgement;Memory;Problem solving     Memory: Decreased short-term memory (Noted history of dementia)   Safety/Judgement: Decreased awareness of safety;Decreased awareness of deficits   Problem Solving: Difficulty sequencing;Requires verbal cues;Requires tactile cues General Comments: Pt refusing care and required much encouragement to participate; he is almost perseverative re: needing to go to the bank, presumably because he was told if he goes home AMA, he would have to pay the hospital bill and insurance would not cover it    General Comments General comments (skin integrity, edema, etc.): Assisted pt in donning his own shorts    Exercises        Assessment/Plan    PT Assessment Patient needs continued PT services  PT Diagnosis Difficulty walking;Generalized weakness   PT Problem List Decreased strength;Decreased range of motion;Decreased activity tolerance;Decreased balance;Decreased mobility;Decreased coordination;Decreased cognition;Decreased knowledge of use of DME;Decreased safety awareness;Decreased knowledge of precautions;Cardiopulmonary status limiting activity  PT Treatment Interventions DME instruction;Gait training;Stair training;Functional mobility training;Therapeutic activities;Therapeutic exercise;Balance training;Cognitive remediation;Patient/family education   PT Goals (Current goals can be found in the Care Plan section) Acute Rehab PT Goals Patient Stated Goal: Insistent of going to the bank and going home PT Goal Formulation: With patient/family Time For Goal  Achievement: 08/28/14    Frequency Min 3X/week   Barriers to discharge        Co-evaluation               End of Session Equipment Utilized During Treatment: Gait belt Activity Tolerance: Patient limited by fatigue Patient left: in chair;with call bell/phone within reach;with chair alarm set;with family/visitor present Nurse Communication: Mobility status         Time: 0911-0940 PT Time Calculation (min) (ACUTE ONLY): 29 min   Charges:   PT Evaluation $Initial PT Evaluation Tier I: 1 Procedure PT Treatments $Gait Training: 8-22 mins   PT G Codes:        Olen PelGarrigan, Saia Derossett Hamff 08/14/2014, 11:03 AM  Van ClinesHolly Marquarius Lofton, PT  Acute Rehabilitation Services Pager [phone] Office [phone]

## 2014-08-14 NOTE — Progress Notes (Signed)
Discharge education completed by RN. Pt and spouse received a copy of discharge paperwork and confirm understanding of follow up appointments and discharge medications. Both deny any questions at this time. IV removed, site is within normal limits. Pt will discharge from the unit via wheelchair. 

## 2014-08-14 NOTE — Discharge Summary (Signed)
Physician Discharge Summary  Patient ID: [name] MRN: [id number] DOB/AGE: 79/07/1922 79 y.o.  Admit date: 08/10/2014 Discharge date: 08/14/2014  Admission Diagnoses: Sepsis from UTI Bilateral leg edema Chronic diastolic heart failure Hypertension Obesity  Discharge Diagnoses:  Principle Problems: * Sepsis due to urinary tract infection * Bilateral leg edema Chronic diastolic heart failure Atrial flutter, paroxysmal Hypertension Obesity Senile dementia Hypokalemia-resolved Superficial left lower leg blister-improved  Discharged Condition: poor  Hospital Course: [name] is a 81 y.o. male with history of HTN, GERD, and CHF, who presents to the Emergency Department complaining of a worsening blister to the LLE in the last 24 hours. Patient states he can no longer wear his compression stocking comfortably due to increased swelling in the lower extremities. His temperature was down to 93.5 degree F. He used CytogeneticistBear hugger warming for 6-8 hours. He was treated with IV fluids and IV antibiotic followed by oral antibiotics. Patient has a feeling that he is dying and wants to go home and die peacefully. He became non-compliant with care for last 48 hours. He is less confused at home per wife. He will finish antibiotic and see me in 1 week.  Consults: cardiology  Significant Diagnostic Studies: labs: Low WBC count of 2.8, and low platelets count of 62 K. BNP was mildly elevated at 189 pg/mL. UA was cloudy and positive for UTI. Chest X-ray showed cardiomegaly and bilateral pleural effusions. EKG-Atrial flutter, low voltage. TSH was normal at 3.4  Treatments: IV hydration and antibiotics: Zosyn, Vancomycin  Followed by oral Cipro.  Discharge Exam: Blood pressure 138/95, pulse 103, temperature 98.5 F (36.9 C), temperature source Oral, resp. rate 18, height 5\' 11"  (1.803 m), weight 107.3 kg (236 lb 8.9 oz), SpO2 99 %.  HEENT: [place]/AT, Eyes-Brown, PERL, EOMI, Conjunctiva-Pink,  Sclera-Non-icteric Neck: + JVD, No bruit, Trachea midline. Lungs: Clear, Bilateral. Cardiac: Regular rhythm, normal S1 and S2, no S3.  Abdomen: Soft, non-tender. Extremities: 2 + edema up to mid thigh bilateral. No cyanosis. No clubbing. CNS: AxOx3, Cranial nerves grossly intact, moves all 4 extremities. Right handed. Skin: Warm and dry Disposition: 01-Home or Self Care     Medication List    STOP taking these medications        metoprolol 50 MG tablet  Commonly known as:  LOPRESSOR      TAKE these medications        carvedilol 3.125 MG tablet  Commonly known as:  COREG  Take 1 tablet (3.125 mg total) by mouth 2 (two) times daily with a meal.     ciprofloxacin 500 MG tablet  Commonly known as:  CIPRO  Take 1 tablet (500 mg total) by mouth 2 (two) times daily.     doxazosin 2 MG tablet  Commonly known as:  CARDURA  Take 4 mg by mouth daily.     furosemide 20 MG tablet  Commonly known as:  LASIX  Take 20 mg by mouth 2 (two) times a week. Take on Monday and Thursday     lisinopril 10 MG tablet  Commonly known as:  PRINIVIL,ZESTRIL  Take 5 mg by mouth daily.     MULTI COMPLETE PO  Take 1 tablet by mouth as needed (only takes soometimes).     omeprazole 20 MG capsule  Commonly known as:  PRILOSEC  Take 20 mg by mouth as needed (for heartburn).     potassium chloride 10 MEQ tablet  Commonly known as:  K-DUR  Take 1 tablet (10 mEq  total) by mouth 2 (two) times daily.     REFRESH OP  Apply 2 drops to eye daily as needed (for itching).           Follow-up Information    Follow up with [facility] S, MD. Schedule an appointment as soon as possible for a visit in 1 week.   Specialty:  Cardiology   Contact information:   [address] [place] [address] [phone]       Signed: Ricki Kaufman 08/14/2014, 1:24 PM

## 2014-08-14 NOTE — Progress Notes (Signed)
Medicare Important Message given?  YES (If response is "NO", the following Medicare IM given date fields will be blank) Date Medicare IM given:08/14/14 Medicare IM given by:  Floretta Petro 

## 2014-08-14 NOTE — Plan of Care (Signed)
Problem: Discharge Progression Outcomes Goal: Hemodynamically stable Outcome: Not Met (add Reason) Pt's BP and HR remains high

## 2014-08-14 NOTE — Care Management Note (Signed)
    Page 1 of 1   08/14/2014     4:17:04 PM CARE MANAGEMENT NOTE 08/14/2014  Patient:  Fleet ContrasCREWS,[place] R   Account Number:  [id number]  Date Initiated:  08/14/2014  Documentation initiated by:  Letha CapeAYLOR,Dinh Ayotte  Subjective/Objective Assessment:   dx leg swelling  admit- lives with spouse.     Action/Plan:   Anticipated DC Date:  08/14/2014   Anticipated DC Plan:  HOME W HOME HEALTH SERVICES      DC Planning Services  CM consult      [facility]   Choice offered to / List presented to:  C-3 Spouse        HH arranged  HH-1 RN  HH-2 PT  HH-3 OT  HH-4 NURSE'S AIDE      HH agency  [facility]   Status of service:  Completed, signed off Medicare Important Message given?  YES (If response is "NO", the following Medicare IM given date fields will be blank) Date Medicare IM given:  08/14/2014 Medicare IM given by:  Letha CapeAYLOR,Kelsye Loomer Date Additional Medicare IM given:   Additional Medicare IM given by:    Discharge Disposition:  HOME W HOME HEALTH SERVICES  Per UR Regulation:  Reviewed for med. necessity/level of care/duration of stay  If discussed at Long Length of Stay Meetings, dates discussed:    Comments:  08/14/14 1615 Letha Capeeborah Kden Wagster RN, BSN [phone] Patient lives with spouse, [facility], [facility], Miranda notified. Soc will begin 24-48 hrs post dc.

## 2014-08-14 NOTE — Progress Notes (Signed)
Pt is refusing all nursing care this morning (VS, medications). Pt is also refusing to eat. L.Spencer PA notified.

## 2014-08-16 LAB — CULTURE, BLOOD (ROUTINE X 2)
CULTURE: NO GROWTH
Culture: NO GROWTH

## 2014-08-28 ENCOUNTER — Encounter: Payer: Self-pay | Admitting: Podiatry

## 2014-08-28 ENCOUNTER — Ambulatory Visit (INDEPENDENT_AMBULATORY_CARE_PROVIDER_SITE_OTHER): Payer: Medicare HMO | Admitting: Podiatry

## 2014-08-28 DIAGNOSIS — B351 Tinea unguium: Secondary | ICD-10-CM

## 2014-08-28 DIAGNOSIS — M79676 Pain in unspecified toe(s): Secondary | ICD-10-CM

## 2014-08-28 NOTE — Progress Notes (Signed)
Patient ID: [name], male   DOB: 02/23/1923, 81 y.o.   MRN: [id number]   Subjective: This patient presents again complaining of painful toenails and requests nail debridement. He describes hospitalization recently.  Objective: Bandage on left lower extremity Pitting edema bilaterally The toenails are hypertrophic, elongated, incurvated, discolored and tender to direct palpation  Assessment: Bilateral edema Symptomatic onychomycoses 6-10  Plan: Debridement of toenails 10 without any bleeding  Reappoint 3 months

## 2014-09-20 ENCOUNTER — Emergency Department (HOSPITAL_COMMUNITY): Payer: Medicare HMO

## 2014-09-20 ENCOUNTER — Encounter (HOSPITAL_COMMUNITY): Payer: Self-pay | Admitting: Emergency Medicine

## 2014-09-20 ENCOUNTER — Observation Stay (HOSPITAL_COMMUNITY)
Admission: EM | Admit: 2014-09-20 | Discharge: 2014-09-21 | Disposition: A | Discharge status: Home / Self Care | Payer: Medicare HMO | Source: Home / Self Care | Attending: Cardiovascular Disease | Admitting: Cardiovascular Disease

## 2014-09-20 DIAGNOSIS — L02415 Cutaneous abscess of right lower limb: Secondary | ICD-10-CM | POA: Insufficient documentation

## 2014-09-20 DIAGNOSIS — L03115 Cellulitis of right lower limb: Secondary | ICD-10-CM | POA: Diagnosis not present

## 2014-09-20 DIAGNOSIS — J449 Chronic obstructive pulmonary disease, unspecified: Secondary | ICD-10-CM | POA: Insufficient documentation

## 2014-09-20 DIAGNOSIS — I10 Essential (primary) hypertension: Secondary | ICD-10-CM

## 2014-09-20 DIAGNOSIS — E119 Type 2 diabetes mellitus without complications: Secondary | ICD-10-CM

## 2014-09-20 DIAGNOSIS — M1990 Unspecified osteoarthritis, unspecified site: Secondary | ICD-10-CM

## 2014-09-20 DIAGNOSIS — K219 Gastro-esophageal reflux disease without esophagitis: Secondary | ICD-10-CM | POA: Diagnosis present

## 2014-09-20 DIAGNOSIS — Z87891 Personal history of nicotine dependence: Secondary | ICD-10-CM | POA: Insufficient documentation

## 2014-09-20 DIAGNOSIS — X58XXXA Exposure to other specified factors, initial encounter: Secondary | ICD-10-CM

## 2014-09-20 DIAGNOSIS — E669 Obesity, unspecified: Secondary | ICD-10-CM | POA: Insufficient documentation

## 2014-09-20 DIAGNOSIS — D709 Neutropenia, unspecified: Secondary | ICD-10-CM | POA: Insufficient documentation

## 2014-09-20 DIAGNOSIS — I209 Angina pectoris, unspecified: Secondary | ICD-10-CM

## 2014-09-20 DIAGNOSIS — Z9119 Patient's noncompliance with other medical treatment and regimen: Secondary | ICD-10-CM | POA: Diagnosis present

## 2014-09-20 DIAGNOSIS — R600 Localized edema: Secondary | ICD-10-CM | POA: Diagnosis not present

## 2014-09-20 DIAGNOSIS — I5032 Chronic diastolic (congestive) heart failure: Secondary | ICD-10-CM | POA: Diagnosis present

## 2014-09-20 DIAGNOSIS — D696 Thrombocytopenia, unspecified: Secondary | ICD-10-CM | POA: Diagnosis present

## 2014-09-20 DIAGNOSIS — A419 Sepsis, unspecified organism: Secondary | ICD-10-CM | POA: Diagnosis present

## 2014-09-20 DIAGNOSIS — Z6832 Body mass index (BMI) 32.0-32.9, adult: Secondary | ICD-10-CM | POA: Insufficient documentation

## 2014-09-20 DIAGNOSIS — Z6833 Body mass index (BMI) 33.0-33.9, adult: Secondary | ICD-10-CM

## 2014-09-20 DIAGNOSIS — F419 Anxiety disorder, unspecified: Secondary | ICD-10-CM | POA: Diagnosis present

## 2014-09-20 DIAGNOSIS — T68XXXA Hypothermia, initial encounter: Secondary | ICD-10-CM

## 2014-09-20 DIAGNOSIS — F0390 Unspecified dementia without behavioral disturbance: Secondary | ICD-10-CM | POA: Diagnosis present

## 2014-09-20 DIAGNOSIS — S80829A Blister (nonthermal), unspecified lower leg, initial encounter: Secondary | ICD-10-CM

## 2014-09-20 DIAGNOSIS — R609 Edema, unspecified: Secondary | ICD-10-CM

## 2014-09-20 DIAGNOSIS — Z66 Do not resuscitate: Secondary | ICD-10-CM | POA: Diagnosis present

## 2014-09-20 LAB — URINALYSIS, ROUTINE W REFLEX MICROSCOPIC
Bilirubin Urine: NEGATIVE
Glucose, UA: NEGATIVE mg/dL
HGB URINE DIPSTICK: NEGATIVE
Ketones, ur: 40 mg/dL — AB
Leukocytes, UA: NEGATIVE
Nitrite: NEGATIVE
Protein, ur: NEGATIVE mg/dL
SPECIFIC GRAVITY, URINE: 1.021 (ref 1.005–1.030)
Urobilinogen, UA: 1 mg/dL (ref 0.0–1.0)
pH: 5.5 (ref 5.0–8.0)

## 2014-09-20 LAB — CBC WITH DIFFERENTIAL/PLATELET
Basophils Absolute: 0 10*3/uL (ref 0.0–0.1)
Basophils Relative: 0 % (ref 0–1)
Eosinophils Absolute: 0 10*3/uL (ref 0.0–0.7)
Eosinophils Relative: 0 % (ref 0–5)
HEMATOCRIT: 40.4 % (ref 39.0–52.0)
HEMOGLOBIN: 14.9 g/dL (ref 13.0–17.0)
LYMPHS PCT: 26 % (ref 12–46)
Lymphs Abs: 0.8 10*3/uL (ref 0.7–4.0)
MCH: 29.7 pg (ref 26.0–34.0)
MCHC: 36.9 g/dL — ABNORMAL HIGH (ref 30.0–36.0)
MCV: 80.5 fL (ref 78.0–100.0)
Monocytes Absolute: 0.3 10*3/uL (ref 0.1–1.0)
Monocytes Relative: 10 % (ref 3–12)
NEUTROS ABS: 1.9 10*3/uL (ref 1.7–7.7)
Neutrophils Relative %: 64 % (ref 43–77)
PLATELETS: 55 10*3/uL — AB (ref 150–400)
RBC: 5.02 MIL/uL (ref 4.22–5.81)
RDW: 18.1 % — ABNORMAL HIGH (ref 11.5–15.5)
WBC: 3 10*3/uL — AB (ref 4.0–10.5)

## 2014-09-20 LAB — BASIC METABOLIC PANEL
Anion gap: 15 (ref 5–15)
BUN: 31 mg/dL — AB (ref 6–23)
CALCIUM: 10.3 mg/dL (ref 8.4–10.5)
CHLORIDE: 106 mmol/L (ref 96–112)
CO2: 19 mmol/L (ref 19–32)
Creatinine, Ser: 1.47 mg/dL — ABNORMAL HIGH (ref 0.50–1.35)
GFR calc Af Amer: 47 mL/min — ABNORMAL LOW (ref >90)
GFR, EST NON AFRICAN AMERICAN: 40 mL/min — AB (ref >90)
GLUCOSE: 59 mg/dL — AB (ref 70–99)
POTASSIUM: 5 mmol/L (ref 3.5–5.1)
SODIUM: 140 mmol/L (ref 135–145)

## 2014-09-20 LAB — BRAIN NATRIURETIC PEPTIDE: B NATRIURETIC PEPTIDE 5: 210.9 pg/mL — AB (ref 0.0–100.0)

## 2014-09-20 LAB — GLUCOSE, CAPILLARY: Glucose-Capillary: 105 mg/dL — ABNORMAL HIGH (ref 70–99)

## 2014-09-20 LAB — MRSA PCR SCREENING: MRSA by PCR: NEGATIVE

## 2014-09-20 LAB — CBG MONITORING, ED: Glucose-Capillary: 79 mg/dL (ref 70–99)

## 2014-09-20 LAB — TROPONIN I: Troponin I: 0.03 ng/mL (ref <0.031)

## 2014-09-20 LAB — I-STAT CG4 LACTIC ACID, ED
LACTIC ACID, VENOUS: 2.45 mmol/L — AB (ref 0.5–2.0)
Lactic Acid, Venous: 1.76 mmol/L (ref 0.5–2.0)

## 2014-09-20 LAB — TSH: TSH: 3.212 u[IU]/mL (ref 0.350–4.500)

## 2014-09-20 MED ORDER — SODIUM CHLORIDE 0.9 % IV BOLUS (SEPSIS)
1000.0000 mL | Freq: Once | INTRAVENOUS | Status: AC
  Administered 2014-09-20: 1000 mL via INTRAVENOUS

## 2014-09-20 MED ORDER — SODIUM CHLORIDE 0.9 % IJ SOLN
3.0000 mL | Freq: Two times a day (BID) | INTRAMUSCULAR | Status: DC
  Administered 2014-09-21: 3 mL via INTRAVENOUS

## 2014-09-20 MED ORDER — SODIUM CHLORIDE 0.9 % IV SOLN
2000.0000 mg | Freq: Once | INTRAVENOUS | Status: AC
  Administered 2014-09-20: 2000 mg via INTRAVENOUS
  Filled 2014-09-20: qty 2000

## 2014-09-20 MED ORDER — SODIUM CHLORIDE 0.9 % IV SOLN: 250.0000 mL | INTRAVENOUS | Status: DC | PRN

## 2014-09-20 MED ORDER — SODIUM CHLORIDE 0.9 % IJ SOLN: 3.0000 mL | INTRAMUSCULAR | Status: DC | PRN

## 2014-09-20 MED ORDER — PIPERACILLIN-TAZOBACTAM 3.375 G IVPB
3.3750 g | Freq: Three times a day (TID) | INTRAVENOUS | Status: DC
  Administered 2014-09-21 (×2): 3.375 g via INTRAVENOUS
  Filled 2014-09-20 (×4): qty 50

## 2014-09-20 MED ORDER — PIPERACILLIN-TAZOBACTAM 3.375 G IVPB 30 MIN
3.3750 g | Freq: Once | INTRAVENOUS | Status: AC
  Administered 2014-09-20: 3.375 g via INTRAVENOUS
  Filled 2014-09-20: qty 50

## 2014-09-20 NOTE — H&P (Signed)
Referring Physician:  JULIA KULZER is an 81 y.o. male.                       Chief Complaint: Leg edema and hypothermia  HPI: 81 y.o. male with history of HTN, GERD, and CHF, who presents to the Emergency Department complaining of a worsening blister to the RLE in the last 24 hours. Patient states he can no longer wear his compression stocking comfortably due to increased swelling in the lower extremities. He reports history of the same and notes having them I&D'ed in the past. There is associated BLE pain. Patient notes BLE edema that is chronic, however, he states this has gotten worse slowly over the past several weeks. He denies any new chest pain or SOB.  His blood pressure was in 017 systolic and temp of 79.3 rectal. Bear hugger applied. Patient appears hallucinating.   Past Medical History  Diagnosis Date  . Hypertension   . GERD (gastroesophageal reflux disease)   . Dry eye   . CHF (congestive heart failure)       Past Surgical History  Procedure Laterality Date  . Cardiac surgery  2008    pt states he had agrowth removed from his heart  . Facial growth      No family history on file. Social History:  reports that he has quit smoking. He has never used smokeless tobacco. He reports that he does not drink alcohol or use illicit drugs.  Allergies: No Known Allergies   (Not in a hospital admission)  Results for orders placed or performed during the hospital encounter of 09/20/14 (from the past 48 hour(s))  CBC with Differential     Status: Abnormal   Collection Time: 09/20/14  4:09 PM  Result Value Ref Range   WBC 3.0 (L) 4.0 - 10.5 K/uL   RBC 5.02 4.22 - 5.81 MIL/uL   Hemoglobin 14.9 13.0 - 17.0 g/dL   HCT 40.4 39.0 - 52.0 %   MCV 80.5 78.0 - 100.0 fL   MCH 29.7 26.0 - 34.0 pg   MCHC 36.9 (H) 30.0 - 36.0 g/dL   RDW 18.1 (H) 11.5 - 15.5 %   Platelets 55 (L) 150 - 400 K/uL    Comment: REPEATED TO VERIFY PLATELET COUNT CONFIRMED BY SMEAR    Neutrophils Relative % 64  43 - 77 %   Neutro Abs 1.9 1.7 - 7.7 K/uL   Lymphocytes Relative 26 12 - 46 %   Lymphs Abs 0.8 0.7 - 4.0 K/uL   Monocytes Relative 10 3 - 12 %   Monocytes Absolute 0.3 0.1 - 1.0 K/uL   Eosinophils Relative 0 0 - 5 %   Eosinophils Absolute 0.0 0.0 - 0.7 K/uL   Basophils Relative 0 0 - 1 %   Basophils Absolute 0.0 0.0 - 0.1 K/uL  Basic metabolic panel     Status: Abnormal   Collection Time: 09/20/14  4:09 PM  Result Value Ref Range   Sodium 140 135 - 145 mmol/L   Potassium 5.0 3.5 - 5.1 mmol/L   Chloride 106 96 - 112 mmol/L   CO2 19 19 - 32 mmol/L   Glucose, Bld 59 (L) 70 - 99 mg/dL   BUN 31 (H) 6 - 23 mg/dL   Creatinine, Ser 1.47 (H) 0.50 - 1.35 mg/dL   Calcium 10.3 8.4 - 10.5 mg/dL   GFR calc non Af Amer 40 (L) >90 mL/min   GFR calc Af Wyvonnia Lora  47 (L) >90 mL/min    Comment: (NOTE) The eGFR has been calculated using the CKD EPI equation. This calculation has not been validated in all clinical situations. eGFR's persistently <90 mL/min signify possible Chronic Kidney Disease.    Anion gap 15 5 - 15  Brain natriuretic peptide     Status: Abnormal   Collection Time: 09/20/14  4:09 PM  Result Value Ref Range   B Natriuretic Peptide 210.9 (H) 0.0 - 100.0 pg/mL  Troponin I     Status: None   Collection Time: 09/20/14  4:09 PM  Result Value Ref Range   Troponin I 0.03 <0.031 ng/mL    Comment:        NO INDICATION OF MYOCARDIAL INJURY.   POC CBG, ED     Status: None   Collection Time: 09/20/14  5:01 PM  Result Value Ref Range   Glucose-Capillary 79 70 - 99 mg/dL  I-Stat CG4 Lactic Acid, ED     Status: Abnormal   Collection Time: 09/20/14  5:16 PM  Result Value Ref Range   Lactic Acid, Venous 2.45 (HH) 0.5 - 2.0 mmol/L   Comment NOTIFIED PHYSICIAN   TSH     Status: None   Collection Time: 09/20/14  5:25 PM  Result Value Ref Range   TSH 3.212 0.350 - 4.500 uIU/mL  Urinalysis, Routine w reflex microscopic     Status: Abnormal   Collection Time: 09/20/14  6:15 PM  Result Value  Ref Range   Color, Urine YELLOW YELLOW   APPearance CLEAR CLEAR   Specific Gravity, Urine 1.021 1.005 - 1.030   pH 5.5 5.0 - 8.0   Glucose, UA NEGATIVE NEGATIVE mg/dL   Hgb urine dipstick NEGATIVE NEGATIVE   Bilirubin Urine NEGATIVE NEGATIVE   Ketones, ur 40 (A) NEGATIVE mg/dL   Protein, ur NEGATIVE NEGATIVE mg/dL   Urobilinogen, UA 1.0 0.0 - 1.0 mg/dL   Nitrite NEGATIVE NEGATIVE   Leukocytes, UA NEGATIVE NEGATIVE    Comment: MICROSCOPIC NOT DONE ON URINES WITH NEGATIVE PROTEIN, BLOOD, LEUKOCYTES, NITRITE, OR GLUCOSE <1000 mg/dL.   Dg Chest Portable 1 View  09/20/2014   CLINICAL DATA:  Bilateral leg swelling, history of hypertension and CHF  EXAM: PORTABLE CHEST - 1 VIEW  COMPARISON:  08/10/2014  FINDINGS: Postsurgical changes are again noted. The cardiac shadow is at the upper limits of normal in size. No significant vascular congestion is noted. No focal infiltrate is seen. No bony abnormality is noted.  IMPRESSION: No active disease.   Electronically Signed   By: Alcide Clever M.D.   On: 09/20/2014 19:00    Review Of Systems + wears glasses, + weight gain + partial dentures, + COPD, + angina, + leg edema, + hypertension, + DM, II, + CHF, No CVA, No seizures, No kidney stone, + arthritis.   Blood pressure 120/81, pulse 97, temperature 93.4 F (34.1 C), temperature source Rectal, resp. rate 27, weight 98.294 kg (216 lb 11.2 oz), SpO2 97 %.  General: Well built and overnourished in no respiratory distress. HEENT: [place]/AT, brown eyes, PERL, EOMI. conjuntiva-pink, sclera-white. Neck: + JVD at 0 degree angle, no bruit, no thyromegaly.  Lungs: Rare basal crackles to auscultation.  Heart: Normal S1 and S2. Grade III/VI systolic murmur LSB  Abdomen: Mild swelling, non-tender.  Ext: 3 + edema up to thighs, bilaterally. 4 x 5 inch irregular blister over right lower leg CNS: Cranial nerves grossly intact. Moves all 4 extremities. Skin: Warm and dry.  Assessment/Plan Hypothermia r/o  sepsis Bilateral leg edema Chronic diastolic heart failure. Hypertension Obesity Thrombocytopenia Neutropenia  Bear hugger use. IV antibiotic use. Comfort care as much as possible.   Birdie Riddle, MD  09/20/2014, 7:43 PM
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
## 2014-09-20 NOTE — Progress Notes (Signed)
Tried notifying MD office to clarify current orders multiple times. Will continue to try.

## 2014-09-20 NOTE — ED Provider Notes (Signed)
I saw and evaluated the patient, reviewed the resident's note and I agree with the findings and plan.   EKG Interpretation   Date/Time:  Wednesday September 20 2014 16:03:59 EDT Ventricular Rate:  93 PR Interval:  165 QRS Duration: 116 QT Interval:  336 QTC Calculation: 418 R Axis:   -20 Text Interpretation:  Ectopic atrial rhythm Nonspecific intraventricular  conduction delay Borderline low voltage, extremity leads Repol abnrm  suggests ischemia, diffuse leads nonspecific ST/T abnormality more  pronounced than prior. Confirmed by [facility]  MD, TREY [id number]) on 09/20/2014  4:08:15 PM      81 yo male with hypothermia and a right leg wound draining serosanguinous fluid.  On exam, well appearing, nontoxic, not distressed, normal respiratory effort, normal perfusion, slightly confused, right leg with large blistering lesion with copious serosanguinous fluid.  Given hypothermia, was evaluated for sepsis with identification of leukopenia, lactic acidosis, and mild AKI.  Plan IV fluids, IV antibiotics, admission.    Clinical Impression: 1. Hypothermia, initial encounter   2. Peripheral edema   3. Blister of leg       Blake DivineJohn Hailey Miles, MD 09/21/14 [phone]

## 2014-09-20 NOTE — Progress Notes (Signed)
Pt. Requested to have his car keys and wallet sent to a safe with security. Went over contents of wallet with patient and had him sign the valuables envelope. Verified by two RN's.

## 2014-09-20 NOTE — ED Notes (Signed)
Pt's temp 92.1 MD aware. Dentistlaced Bair hugger on pt
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
## 2014-09-20 NOTE — Progress Notes (Signed)
ANTIBIOTIC CONSULT NOTE - INITIAL  Pharmacy Consult for Zosyn Indication: sepsis  No Known Allergies  Patient Measurements: Weight: 216 lb 11.2 oz (98.294 kg)  Vital Signs: Temp: 92.1 F (33.4 C) (04/20 1631) Temp Source: Rectal (04/20 1631) BP: 128/85 mmHg (04/20 1715) Pulse Rate: 89 (04/20 1715) Intake/Output from previous day:   Intake/Output from this shift:    Labs: No results for input(s): WBC, HGB, PLT, LABCREA, CREATININE in the last 72 hours. CrCl cannot be calculated (Patient has no serum creatinine result on file.). No results for input(s): VANCOTROUGH, VANCOPEAK, VANCORANDOM, GENTTROUGH, GENTPEAK, GENTRANDOM, TOBRATROUGH, TOBRAPEAK, TOBRARND, AMIKACINPEAK, AMIKACINTROU, AMIKACIN in the last 72 hours.   Microbiology: No results found for this or any previous visit (from the past 720 hour(s)).  Medical History: Past Medical History  Diagnosis Date  . Hypertension   . GERD (gastroesophageal reflux disease)   . Dry eye   . CHF (congestive heart failure)     Medications:  Anti-infectives    Start     Dose/Rate Route Frequency Ordered Stop   09/20/14 1700  vancomycin (VANCOCIN) 2,000 mg in sodium chloride 0.9 % 500 mL IVPB     2,000 mg 250 mL/hr over 120 Minutes Intravenous  Once 09/20/14 1657       Assessment: [age] yoM who presents with lower extremity edema and weeping blister on right lower extremity. Pharmacy consulted to dose Zosyn for sepsis. LA 2.45, Temp 92.1, WBC 2.9, SCr 1.14 (CrCl ~60-65 ml/min)  4/20 BCx2 >> 4/20 UCx >>  Vanc 4/20 >> Zosyn 4/20 >>  Goal of Therapy:  Clinical resolution of infection  Plan:  Zosyn 3.375 g IV load, then 3.375 g IV q8h Vanc 2 g IV load Follow up continuation of vancomycin Monitor C&S, clinical progress  Conception ChancyMegan Shah, PharmD candidate  09/20/2014,5:27 PM    I agree with the assessment and plan.   Agapito GamesAlison Toniyah Dilmore, PharmD, BCPS Clinical Pharmacist Pager: [phone] 09/20/2014 6:04 PM

## 2014-09-21 DIAGNOSIS — T68XXXA Hypothermia, initial encounter: Secondary | ICD-10-CM | POA: Diagnosis present

## 2014-09-21 NOTE — Care Management Note (Signed)
    Page 1 of 1   09/21/2014     1:50:09 PM CARE MANAGEMENT NOTE 09/21/2014  Patient:  [name],[name]   Account Number:  [id number]  Date Initiated:  09/21/2014  Documentation initiated by:  Junius CreamerWELL,DEBBIE  Subjective/Objective Assessment:   adm w hypotermia, iv antibiotics     Action/Plan:   lives w wife, pcp dr Algie Cofferkadakia   Anticipated DC Date:     Anticipated DC Plan:  HOME W HOME HEALTH SERVICES         Choice offered to / List presented to:             Status of service:   Medicare Important Message given?   (If response is "NO", the following Medicare IM given date fields will be blank) Date Medicare IM given:   Medicare IM given by:   Date Additional Medicare IM given:   Additional Medicare IM given by:    Discharge Disposition:    Per UR Regulation:  Reviewed for med. necessity/level of care/duration of stay  If discussed at Long Length of Stay Meetings, dates discussed:    Comments:
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
## 2014-09-21 NOTE — Discharge Summary (Signed)
Physician Discharge Summary  Patient ID: [name] MRN: [id number] DOB/AGE: 79/07/1922 79 y.o.  Admit date: 09/20/2014 Discharge date: 09/21/2014  Admission Diagnoses: Hypothermia r/o sepsis Bilateral leg edema Chronic diastolic heart failure. Hypertension Obesity Thrombocytopenia Neutropenia Discharge Diagnoses:  Principle Problem: * Sepsis affecting skin * Hypothermia Bilateral leg edema Chronic diastolic heart failure. Hypertension Obesity Thrombocytopenia Neutropenia  Discharged Condition: fair  Hospital Course: 81 y.o. male with history of HTN, GERD, and CHF, who presents to the Emergency Department complaining of a worsening blister to the RLE in the last 24 hours. Patient states he can no longer wear his compression stocking comfortably due to increased swelling in the lower extremities. He reports history of the same and notes having them I&D'ed in the past. There is associated BLE pain. Patient notes BLE edema that is chronic, however, he states this has gotten worse slowly over the past several weeks. He denies any new chest pain or SOB. His blood pressure was in 100 systolic and temp of 92.1 rectal. Bear hugger applied. Patient appears hallucinating. This morning his vital signs are stable. He is awake and alert and wants to go home and die peacefully.   Consults: None  Significant Diagnostic Studies: labs: Chronic neutropenia and thrombocytopenia. Normal BMET with mildly elevated BUN/Cr of 31/1.47  Treatments: IV hydration and antibiotics: Zosyn  Discharge Exam: Blood pressure 92/74, pulse 99, temperature 98.1 F (36.7 C), temperature source Oral, resp. rate 15, height 5\' 9"  (1.753 m), weight 99 kg (218 lb 4.1 oz), SpO2 98 %. General: Well built and overnourished in no respiratory distress. HEENT: [place]/AT, brown eyes, PERL, EOMI. conjuntiva-pink, sclera-white. Neck: + JVD at 0 degree angle, no bruit, no thyromegaly.  Lungs: Rare basal crackles to auscultation.   Heart: Normal S1 and S2. Grade III/VI systolic murmur LSB  Abdomen: Mild swelling, non-tender.  Ext: 3 + edema up to thighs, bilaterally. 4 x 5 inch irregular blister over right lower leg CNS: Cranial nerves grossly intact. Moves all 4 extremities. Skin: Warm and dry.  Disposition: 01-Home or Self Care     Medication List    STOP taking these medications        doxazosin 2 MG tablet  Commonly known as:  CARDURA     lisinopril 10 MG tablet  Commonly known as:  PRINIVIL,ZESTRIL     potassium chloride 10 MEQ tablet  Commonly known as:  K-DUR      TAKE these medications        carvedilol 3.125 MG tablet  Commonly known as:  COREG  Take 1 tablet (3.125 mg total) by mouth 2 (two) times daily with a meal.     furosemide 20 MG tablet  Commonly known as:  LASIX  Take 20 mg by mouth 2 (two) times a week. Take on Monday and Thursday     MULTI COMPLETE PO  Take 1 tablet by mouth as needed (only takes soometimes).     omeprazole 20 MG capsule  Commonly known as:  PRILOSEC  Take 20 mg by mouth as needed (for heartburn).           Follow-up Information    [facility]Lilliem[facility] Fruge S, MD. Schedule an appointment as soon as possible for a visit in 1 week.   Specialty:  Cardiology   Contact information:   [address] [place] [address] [phone]       Signed: [name],Azari Hasler S 09/21/2014, 2:54 PM

## 2014-09-21 NOTE — Progress Notes (Signed)
While waiting discharge, RN assessed possible rhythm change on pt's monitor. EKG obtained. No new changes from previous EKG. VS remain the change. Waiting for pt's wife to bring clothes for him to be discharged.

## 2014-09-21 NOTE — Progress Notes (Signed)
Pt removed telemetry monitoring. RN to bedside. Pt trying to get out of chair. RN placed pt back on telemetry monitor and repositioned pt back into chair. RN reinforced safety education and asked pt to remain in chair. Chair alarm reassessed and active. RN called Dr. Algie CofferKadakia to update him on pt's condition. Dr. Algie CofferKadakia coming to bedside. RN noted pt removed himself from telemetry monitoring. RN to beside. PT found almost out of chair with IV catheter out, and arm actively bleeding. RN placed gloved hand over bleeding IV insertion site. Second RN to beside to assist. Pt placed back into chair with chair alarm and telemetry monitor back on. Dr. Algie CofferKadakia now at bedside. Will continue to monitor pt slowly.

## 2014-09-21 NOTE — Progress Notes (Signed)
Discussed pt's condition with wife. She explained his paranoia and hallucinations have been going on for about a month. She expressed she is unsure if she will be able to care for him at home once he is discharge. RN encouraged wife to express this to the MD, so we can begin to assess pt's need.

## 2014-09-22 LAB — URINE CULTURE: Colony Count: 3000

## 2014-09-23 ENCOUNTER — Encounter (HOSPITAL_COMMUNITY): Payer: Self-pay | Admitting: Emergency Medicine

## 2014-09-23 ENCOUNTER — Inpatient Hospital Stay (HOSPITAL_COMMUNITY)
Admission: EM | Admit: 2014-09-23 | Discharge: 2014-09-27 | DRG: 603 | Disposition: A | Discharge status: Skilled Nursing Facility | Payer: Medicare HMO | Attending: Cardiovascular Disease | Admitting: Cardiovascular Disease

## 2014-09-23 ENCOUNTER — Emergency Department (HOSPITAL_COMMUNITY): Payer: Medicare HMO

## 2014-09-23 ENCOUNTER — Encounter (HOSPITAL_COMMUNITY): Payer: Self-pay | Admitting: Nurse Practitioner

## 2014-09-23 ENCOUNTER — Emergency Department (INDEPENDENT_AMBULATORY_CARE_PROVIDER_SITE_OTHER)
Admission: EM | Admit: 2014-09-23 | Discharge: 2014-09-23 | Disposition: A | Discharge status: Hospice | Payer: Medicare HMO | Source: Home / Self Care | Attending: Family Medicine | Admitting: Family Medicine

## 2014-09-23 DIAGNOSIS — R600 Localized edema: Secondary | ICD-10-CM | POA: Diagnosis present

## 2014-09-23 DIAGNOSIS — I5021 Acute systolic (congestive) heart failure: Secondary | ICD-10-CM | POA: Diagnosis not present

## 2014-09-23 DIAGNOSIS — D696 Thrombocytopenia, unspecified: Secondary | ICD-10-CM | POA: Diagnosis present

## 2014-09-23 DIAGNOSIS — F0390 Unspecified dementia without behavioral disturbance: Secondary | ICD-10-CM | POA: Diagnosis present

## 2014-09-23 DIAGNOSIS — I10 Essential (primary) hypertension: Secondary | ICD-10-CM | POA: Diagnosis present

## 2014-09-23 DIAGNOSIS — Z6833 Body mass index (BMI) 33.0-33.9, adult: Secondary | ICD-10-CM | POA: Diagnosis not present

## 2014-09-23 DIAGNOSIS — Z87891 Personal history of nicotine dependence: Secondary | ICD-10-CM | POA: Diagnosis not present

## 2014-09-23 DIAGNOSIS — K219 Gastro-esophageal reflux disease without esophagitis: Secondary | ICD-10-CM | POA: Diagnosis present

## 2014-09-23 DIAGNOSIS — L03116 Cellulitis of left lower limb: Secondary | ICD-10-CM

## 2014-09-23 DIAGNOSIS — I5032 Chronic diastolic (congestive) heart failure: Secondary | ICD-10-CM | POA: Diagnosis present

## 2014-09-23 DIAGNOSIS — L03115 Cellulitis of right lower limb: Secondary | ICD-10-CM | POA: Diagnosis present

## 2014-09-23 DIAGNOSIS — Z9119 Patient's noncompliance with other medical treatment and regimen: Secondary | ICD-10-CM | POA: Diagnosis present

## 2014-09-23 DIAGNOSIS — R7881 Bacteremia: Secondary | ICD-10-CM

## 2014-09-23 DIAGNOSIS — F419 Anxiety disorder, unspecified: Secondary | ICD-10-CM | POA: Diagnosis present

## 2014-09-23 DIAGNOSIS — Z66 Do not resuscitate: Secondary | ICD-10-CM | POA: Diagnosis present

## 2014-09-23 DIAGNOSIS — E669 Obesity, unspecified: Secondary | ICD-10-CM | POA: Diagnosis present

## 2014-09-23 DIAGNOSIS — D709 Neutropenia, unspecified: Secondary | ICD-10-CM | POA: Diagnosis present

## 2014-09-23 LAB — CULTURE, BLOOD (ROUTINE X 2)

## 2014-09-23 LAB — CBC WITH DIFFERENTIAL/PLATELET
BASOS PCT: 0 % (ref 0–1)
Basophils Absolute: 0 10*3/uL (ref 0.0–0.1)
Eosinophils Absolute: 0 10*3/uL (ref 0.0–0.7)
Eosinophils Relative: 1 % (ref 0–5)
HCT: 38.1 % — ABNORMAL LOW (ref 39.0–52.0)
Hemoglobin: 13.6 g/dL (ref 13.0–17.0)
Lymphocytes Relative: 19 % (ref 12–46)
Lymphs Abs: 0.6 10*3/uL — ABNORMAL LOW (ref 0.7–4.0)
MCH: 28.9 pg (ref 26.0–34.0)
MCHC: 35.7 g/dL (ref 30.0–36.0)
MCV: 80.9 fL (ref 78.0–100.0)
MONOS PCT: 13 % — AB (ref 3–12)
Monocytes Absolute: 0.4 10*3/uL (ref 0.1–1.0)
NEUTROS ABS: 1.9 10*3/uL (ref 1.7–7.7)
NEUTROS PCT: 66 % (ref 43–77)
Platelets: 52 10*3/uL — ABNORMAL LOW (ref 150–400)
RBC: 4.71 MIL/uL (ref 4.22–5.81)
RDW: 18.2 % — ABNORMAL HIGH (ref 11.5–15.5)
WBC: 2.9 10*3/uL — ABNORMAL LOW (ref 4.0–10.5)

## 2014-09-23 LAB — COMPREHENSIVE METABOLIC PANEL
ALBUMIN: 3.3 g/dL — AB (ref 3.5–5.2)
ALT: 24 U/L (ref 0–53)
AST: 44 U/L — ABNORMAL HIGH (ref 0–37)
Alkaline Phosphatase: 104 U/L (ref 39–117)
Anion gap: 9 (ref 5–15)
BUN: 18 mg/dL (ref 6–23)
CO2: 26 mmol/L (ref 19–32)
CREATININE: 1.12 mg/dL (ref 0.50–1.35)
Calcium: 9.9 mg/dL (ref 8.4–10.5)
Chloride: 107 mmol/L (ref 96–112)
GFR calc Af Amer: 65 mL/min — ABNORMAL LOW (ref >90)
GFR, EST NON AFRICAN AMERICAN: 56 mL/min — AB (ref >90)
Glucose, Bld: 87 mg/dL (ref 70–99)
Potassium: 4.1 mmol/L (ref 3.5–5.1)
Sodium: 142 mmol/L (ref 135–145)
TOTAL PROTEIN: 8.1 g/dL (ref 6.0–8.3)
Total Bilirubin: 2.6 mg/dL — ABNORMAL HIGH (ref 0.3–1.2)

## 2014-09-23 LAB — BRAIN NATRIURETIC PEPTIDE: B Natriuretic Peptide: 200 pg/mL — ABNORMAL HIGH (ref 0.0–100.0)

## 2014-09-23 MED ORDER — ALUM & MAG HYDROXIDE-SIMETH 200-200-20 MG/5ML PO SUSP: 30.0000 mL | Freq: Four times a day (QID) | ORAL | Status: DC | PRN

## 2014-09-23 MED ORDER — ADULT MULTIVITAMIN W/MINERALS CH
1.0000 | ORAL_TABLET | Freq: Every day | ORAL | Status: DC
  Administered 2014-09-24 – 2014-09-25 (×2): 1 via ORAL
  Filled 2014-09-23 (×4): qty 1

## 2014-09-23 MED ORDER — POTASSIUM CHLORIDE ER 10 MEQ PO TBCR: 10.0000 meq | EXTENDED_RELEASE_TABLET | Freq: Two times a day (BID) | ORAL | Status: DC

## 2014-09-23 MED ORDER — POLYVINYL ALCOHOL 1.4 % OP SOLN
1.0000 [drp] | Freq: Three times a day (TID) | OPHTHALMIC | Status: DC
  Administered 2014-09-24 – 2014-09-27 (×7): 1 [drp] via OPHTHALMIC
  Filled 2014-09-23: qty 15

## 2014-09-23 MED ORDER — FUROSEMIDE 10 MG/ML IJ SOLN
40.0000 mg | Freq: Once | INTRAMUSCULAR | Status: AC
  Administered 2014-09-23: 40 mg via INTRAVENOUS
  Filled 2014-09-23: qty 4

## 2014-09-23 MED ORDER — ONDANSETRON HCL 4 MG/2ML IJ SOLN
4.0000 mg | Freq: Four times a day (QID) | INTRAMUSCULAR | Status: DC | PRN
  Administered 2014-09-24: 4 mg via INTRAVENOUS
  Filled 2014-09-23: qty 2

## 2014-09-23 MED ORDER — PANTOPRAZOLE SODIUM 40 MG PO TBEC
40.0000 mg | DELAYED_RELEASE_TABLET | Freq: Every day | ORAL | Status: DC
  Administered 2014-09-24 – 2014-09-25 (×2): 40 mg via ORAL
  Filled 2014-09-23 (×3): qty 1

## 2014-09-23 MED ORDER — DEXTROSE 5 % IV SOLN
1.0000 g | INTRAVENOUS | Status: DC
  Administered 2014-09-24 – 2014-09-25 (×2): 1 g via INTRAVENOUS
  Filled 2014-09-23 (×3): qty 10

## 2014-09-23 MED ORDER — DOCUSATE SODIUM 100 MG PO CAPS
100.0000 mg | ORAL_CAPSULE | Freq: Two times a day (BID) | ORAL | Status: DC
  Administered 2014-09-24 – 2014-09-26 (×3): 100 mg via ORAL
  Filled 2014-09-23 (×8): qty 1

## 2014-09-23 MED ORDER — DOXAZOSIN MESYLATE 4 MG PO TABS
4.0000 mg | ORAL_TABLET | Freq: Every day | ORAL | Status: DC
  Administered 2014-09-24: 4 mg via ORAL
  Filled 2014-09-23 (×4): qty 1

## 2014-09-23 MED ORDER — ONDANSETRON HCL 4 MG PO TABS
4.0000 mg | ORAL_TABLET | Freq: Four times a day (QID) | ORAL | Status: DC | PRN
  Administered 2014-09-25: 4 mg via ORAL
  Filled 2014-09-23 (×2): qty 1

## 2014-09-23 MED ORDER — ACETAMINOPHEN 325 MG PO TABS
650.0000 mg | ORAL_TABLET | Freq: Four times a day (QID) | ORAL | Status: DC | PRN
  Filled 2014-09-23 (×2): qty 2

## 2014-09-23 MED ORDER — CARVEDILOL 3.125 MG PO TABS
3.1250 mg | ORAL_TABLET | Freq: Two times a day (BID) | ORAL | Status: DC
  Administered 2014-09-24 – 2014-09-26 (×5): 3.125 mg via ORAL
  Filled 2014-09-23 (×10): qty 1

## 2014-09-23 MED ORDER — SULFAMETHOXAZOLE-TRIMETHOPRIM 800-160 MG PO TABS: 1.0000 | ORAL_TABLET | Freq: Two times a day (BID) | ORAL | Status: DC

## 2014-09-23 MED ORDER — ACETAMINOPHEN 650 MG RE SUPP: 650.0000 mg | Freq: Four times a day (QID) | RECTAL | Status: DC | PRN

## 2014-09-23 MED ORDER — LISINOPRIL 5 MG PO TABS
5.0000 mg | ORAL_TABLET | Freq: Every day | ORAL | Status: DC
  Filled 2014-09-23 (×2): qty 1

## 2014-09-23 NOTE — ED Notes (Signed)
Spoke with lab about adding on a BNP- was informed that they would add it on.

## 2014-09-23 NOTE — ED Notes (Signed)
Attempted report 

## 2014-09-23 NOTE — ED Provider Notes (Signed)
Fleet ContrasFrank R Capizzi is a 81 y.o. male who presents to Urgent Care today for leg swelling. Patient was discharged from the hospital 2 days ago with probable sepsis. This is treated with about 12 hours of Zosyn. He felt better in the morning he was discharged to go home and die peacefully. Patient is in clinic today complaining of leg swelling and wishing treatment to remove the swelling from his legs. Neither the patient nor his family have any understanding about the seriousness of his disease nor his wish to die peacefully at home. When asked he states that he would like to live and requests treatment to do so. He would prefer treatment at home if possible. He denies any chest pains or palpitations.   Past Medical History  Diagnosis Date  . Hypertension   . GERD (gastroesophageal reflux disease)   . Dry eye   . CHF (congestive heart failure)    Past Surgical History  Procedure Laterality Date  . Cardiac surgery  2008    pt states he had agrowth removed from his heart  . Facial growth     History  Substance Use Topics  . Smoking status: Former Games developermoker  . Smokeless tobacco: Never Used  . Alcohol Use: No   ROS as above Medications: No current facility-administered medications for this encounter.   Current Outpatient Prescriptions  Medication Sig Dispense Refill  . carvedilol (COREG) 3.125 MG tablet Take 1 tablet (3.125 mg total) by mouth 2 (two) times daily with a meal. 60 tablet 1  . furosemide (LASIX) 20 MG tablet Take 20 mg by mouth 2 (two) times a week. Take on Monday and Thursday    . Multiple Vitamins-Minerals (MULTI COMPLETE PO) Take 1 tablet by mouth as needed (only takes soometimes).     Marland Kitchen. omeprazole (PRILOSEC) 20 MG capsule Take 20 mg by mouth as needed (for heartburn).      No Known Allergies   Exam:  BP 113/84 mmHg  Pulse 95  Resp 16  SpO2 100% unable to obtain a temperature Gen: Well NAD HEENT: EOMI,  MMM Lungs: Normal work of breathing. CTABL Heart: RRR no MRG Abd:  NABS, Soft. Nondistended, Nontender Exts: Massive lower extremity edema with chronic wounds Neuro: Alert and confused appearing.  One of 2 blood cultures growing Viridians Steptococcus   No results found for this or any previous visit (from the past 24 hour(s)). No results found.  Assessment and Plan: 81 y.o. male with probable sepsis and heart failure. Patient has likely bacteremia and probable sepsis with signs and symptoms of heart failure. He wishes to continue treatment. I am unable to arrange either hospice services nor home health services at urgent care. Transfer to ED for further evaluation and management.  Patient and his family would benefit greatly from a palliative care consult to discuss goals of care and to arrange hospice services if possible. Certainly if he wishes to continue treatment he may benefit from home health services for his IV antibiotics he will likely require for his bacteremia.    Discussed warning signs or symptoms. Please see discharge instructions. Patient expresses understanding.     Rodolph BongEvan S Corey, MD 09/23/14 1710

## 2014-09-23 NOTE — ED Notes (Signed)
[facility] pitting edema/weeping blister to RLE.

## 2014-09-23 NOTE — ED Provider Notes (Signed)
CSN: [id number]     Arrival date & time 09/23/14  1741 History   First MD Initiated Contact with Patient 09/23/14 1931     Chief Complaint  Patient presents with  . Wound Infection   Patient is a 81 y.o. male presenting with general illness. The history is provided by the spouse, the patient and medical records.  Illness Location:  Bilateral lower extremities Quality:  Edematous Severity:  Severe Onset quality:  Gradual Timing:  Constant Progression:  Worsening Chronicity:  New Context:  History of heart failure Relieved by:  Nothing Worsened by:  Nothing Ineffective treatments:  Takes Lasix at home Associated symptoms: no abdominal pain, no chest pain, no congestion, no diarrhea, no fever, no nausea, no shortness of breath and no vomiting     Past Medical History  Diagnosis Date  . Hypertension   . GERD (gastroesophageal reflux disease)   . Dry eye   . CHF (congestive heart failure)    Past Surgical History  Procedure Laterality Date  . Cardiac surgery  2008    pt states he had agrowth removed from his heart  . Facial growth     History reviewed. No pertinent family history. History  Substance Use Topics  . Smoking status: Former Games developer  . Smokeless tobacco: Never Used  . Alcohol Use: No    Review of Systems  Constitutional: Negative for fever and chills.  HENT: Negative for congestion.   Respiratory: Negative for shortness of breath.   Cardiovascular: Positive for leg swelling. Negative for chest pain.  Gastrointestinal: Negative for nausea, vomiting, abdominal pain and diarrhea.  Genitourinary: Negative for decreased urine volume.  Skin: Positive for wound.  Neurological: Negative for seizures.  Psychiatric/Behavioral: Negative for confusion.      Allergies  Review of patient's allergies indicates no known allergies.  Home Medications   Prior to Admission medications   Medication Sig Start Date End Date Taking? Authorizing Provider  carvedilol  (COREG) 3.125 MG tablet Take 1 tablet (3.125 mg total) by mouth 2 (two) times daily with a meal. 08/14/14   Orpah Cobb, MD  furosemide (LASIX) 20 MG tablet Take 20 mg by mouth 2 (two) times a week. Take on Monday and Thursday    Historical Provider, MD  Multiple Vitamins-Minerals (MULTI COMPLETE PO) Take 1 tablet by mouth as needed (only takes soometimes).     Historical Provider, MD  omeprazole (PRILOSEC) 20 MG capsule Take 20 mg by mouth as needed (for heartburn).     Historical Provider, MD   BP 105/73 mmHg  Pulse 98  Temp(Src) 98.6 F (37 C)  Resp 18  Ht  (1.702 m)  SpO2 100% Physical Exam  Constitutional: He is oriented to person, place, and time. He appears well-developed and well-nourished. No distress.  HENT:  Head: Normocephalic and atraumatic.  Right Ear: External ear normal.  Left Ear: External ear normal.  Mouth/Throat: Oropharynx is clear and moist.  Eyes: EOM are normal. Pupils are equal, round, and reactive to light.  Neck: Normal range of motion.  Cardiovascular: Normal rate and regular rhythm.   Pitting edema bilaterally to above the knee  Pulmonary/Chest: Effort normal. No respiratory distress. He has wheezes (expiratory wheezing on left lower lobe). He has no rales.  Abdominal: Soft. He exhibits no distension. There is no tenderness. There is no rebound and no guarding.  Neurological: He is alert and oriented to person, place, and time.  Skin: Skin is warm and dry. No rash noted. He is  not diaphoretic.     Psychiatric: He has a normal mood and affect.  Vitals reviewed.   ED Course  Procedures (including critical care time) Labs Review Labs Reviewed  CBC WITH DIFFERENTIAL/PLATELET - Abnormal; Notable for the following:    WBC 2.9 (*)    HCT 38.1 (*)    RDW 18.2 (*)    Platelets 52 (*)    Lymphs Abs 0.6 (*)    Monocytes Relative 13 (*)    All other components within normal limits  COMPREHENSIVE METABOLIC PANEL - Abnormal; Notable for the following:     Albumin 3.3 (*)    AST 44 (*)    Total Bilirubin 2.6 (*)    GFR calc non Af Amer 56 (*)    GFR calc Af Amer 65 (*)    All other components within normal limits  BRAIN NATRIURETIC PEPTIDE - Abnormal; Notable for the following:    B Natriuretic Peptide 200.0 (*)    All other components within normal limits  CBC  BASIC METABOLIC PANEL    Imaging Review Dg Chest Portable 1 View  09/23/2014   CLINICAL DATA:  Wound infection.  Bilateral leg swelling  EXAM: PORTABLE CHEST - 1 VIEW  COMPARISON:  09/20/2014  FINDINGS: Cardiac enlargement. Median sternotomy. Negative for heart failure. Lungs are clear without infiltrate or effusion.  IMPRESSION: No active disease.   Electronically Signed   By: Marlan Palauharles  Clark M.D.   On: 09/23/2014 20:09     EKG Interpretation None      MDM   Final diagnoses:  Bilateral edema of lower extremity   81 year old male past medical history of CHF, hypertension, recent admission for sepsis and lower extremity edema. Since the ED after being seen in urgent care for bilateral lower extremity edema. Review of records reveal that on his discharge summary he endorsed wishing to go home and die peacefully. Currently today he states he does not want to die and wishes to be readmitted. He denies fevers, chills, chest pain, shortness of breath, palpitations. Denies abdominal pain, nausea, vomiting, diarrhea. Endorses bilateral lower tissue of any edema with weeping wounds on the right lower shin that are tender.  Exam as above. Notable for elderly male in no acute distress. Bilateral lower extremity edema to just above the knee with large 10 cm x 5 cm blister on the anterior right shin that is weeping fluid. He is smiling, pleasant, in no acute distress.  CBC and CMP were obtained at urgent care prior to arrival. Notable for a white count of 2.9 hemoglobin 3.6 platelet count 52. CMP notable for a mildly elevated AST and elevated total bili. BNP elevated at 200. Chest x-ray  shows no active disease.  The patient was given IV Lasix in the ED. He had a single erroneous blood pressure reading after that that was 80s over 70s. Subsequent repeat blood pressures have been stable and normal. [facility].  I discussed his case with Dr. Algie CofferKadakia, the patient's PCP. He will admit the patient to his service.   This case was managed in conjunction with my attending, Dr. Margarita Grizzleanielle Ray.    Maxine GlennAnn Luane Rochon, MD 09/23/14 [id number]  Margarita Grizzleanielle Ray, MD 09/24/14 [id number]

## 2014-09-23 NOTE — ED Notes (Signed)
C/o edema in bilateral legs States fluid retention on legs States this has been going on for years

## 2014-09-23 NOTE — H&P (Signed)
Referring Physician:  SHOOTER Kaufman is an 81 y.o. male.                       Chief Complaint: Right leg cellulitis  HPI: 81 year old male with recurrent leg edema has blisters and swelling of right leg more than left one. He had sepsis affecting skin infection of lower legs 3 days ago and asked for discharge home with oral antibiotics. He has history of hypertension, GERD and CHF along with thrombocytopenia and neutropenia. He is not hypothermic but mildly hypotensive today and looks sick and says he will stay in hospital to get well.  Past Medical History  Diagnosis Date  . Hypertension   . GERD (gastroesophageal reflux disease)   . Dry eye   . CHF (congestive heart failure)       Past Surgical History  Procedure Laterality Date  . Cardiac surgery  2008    pt states he had agrowth removed from his heart  . Facial growth      History reviewed. No pertinent family history. Social History:  reports that he has quit smoking. He has never used smokeless tobacco. He reports that he does not drink alcohol or use illicit drugs.  Allergies: No Known Allergies   (Not in a hospital admission)  Results for orders placed or performed during the hospital encounter of 09/23/14 (from the past 48 hour(s))  CBC WITH DIFFERENTIAL     Status: Abnormal   Collection Time: 09/23/14  6:02 PM  Result Value Ref Range   WBC 2.9 (L) 4.0 - 10.5 K/uL   RBC 4.71 4.22 - 5.81 MIL/uL   Hemoglobin 13.6 13.0 - 17.0 g/dL   HCT 18.8 (L) 71.1 - 70.6 %   MCV 80.9 78.0 - 100.0 fL   MCH 28.9 26.0 - 34.0 pg   MCHC 35.7 30.0 - 36.0 g/dL   RDW 78.5 (H) 52.3 - 90.6 %   Platelets 52 (L) 150 - 400 K/uL    Comment: SPECIMEN CHECKED FOR CLOTS REPEATED TO VERIFY PLATELET COUNT CONFIRMED BY SMEAR    Neutrophils Relative % 66 43 - 77 %   Neutro Abs 1.9 1.7 - 7.7 K/uL   Lymphocytes Relative 19 12 - 46 %   Lymphs Abs 0.6 (L) 0.7 - 4.0 K/uL   Monocytes Relative 13 (H) 3 - 12 %   Monocytes Absolute 0.4 0.1 - 1.0 K/uL    Eosinophils Relative 1 0 - 5 %   Eosinophils Absolute 0.0 0.0 - 0.7 K/uL   Basophils Relative 0 0 - 1 %   Basophils Absolute 0.0 0.0 - 0.1 K/uL  Comprehensive metabolic panel     Status: Abnormal   Collection Time: 09/23/14  6:02 PM  Result Value Ref Range   Sodium 142 135 - 145 mmol/L   Potassium 4.1 3.5 - 5.1 mmol/L   Chloride 107 96 - 112 mmol/L   CO2 26 19 - 32 mmol/L   Glucose, Bld 87 70 - 99 mg/dL   BUN 18 6 - 23 mg/dL   Creatinine, Ser 3.35 0.50 - 1.35 mg/dL   Calcium 9.9 8.4 - 53.3 mg/dL   Total Protein 8.1 6.0 - 8.3 g/dL   Albumin 3.3 (L) 3.5 - 5.2 g/dL   AST 44 (H) 0 - 37 U/L   ALT 24 0 - 53 U/L   Alkaline Phosphatase 104 39 - 117 U/L   Total Bilirubin 2.6 (H) 0.3 - 1.2 mg/dL  GFR calc non Af Amer 56 (L) >90 mL/min   GFR calc Af Amer 65 (L) >90 mL/min    Comment: (NOTE) The eGFR has been calculated using the CKD EPI equation. This calculation has not been validated in all clinical situations. eGFR's persistently <90 mL/min signify possible Chronic Kidney Disease.    Anion gap 9 5 - 15  Brain natriuretic peptide     Status: Abnormal   Collection Time: 09/23/14  6:02 PM  Result Value Ref Range   B Natriuretic Peptide 200.0 (H) 0.0 - 100.0 pg/mL   Dg Chest Portable 1 View  09/23/2014   CLINICAL DATA:  Wound infection.  Bilateral leg swelling  EXAM: PORTABLE CHEST - 1 VIEW  COMPARISON:  09/20/2014  FINDINGS: Cardiac enlargement. Median sternotomy. Negative for heart failure. Lungs are clear without infiltrate or effusion.  IMPRESSION: No active disease.   Electronically Signed   By: Franchot Gallo M.D.   On: 09/23/2014 20:09    Review Of Systems + wears glasses, + weight gain + partial dentures, + COPD, + angina, + leg edema, + hypertension, + DM, II, + CHF, No CVA, No seizures, No kidney stone, + arthritis.  Blood pressure 86/72, pulse 92, temperature 98.6 F (37 C), resp. rate 16, height $RemoveBe'5\' 7"'KLYluItDd$  (1.702 m), SpO2 100 %. Physical Exam: General: Well built and  overnourished in no respiratory distress. HEENT: [place]/AT, brown eyes, PERL, EOMI. conjuntiva-pink, sclera-white. Neck: + JVD at 0 degree angle, no bruit, no thyromegaly.  Lungs: Rare basal crackles to auscultation.  Heart: Normal S1 and S2. Grade III/VI systolic murmur LSB  Abdomen: Mild swelling, non-tender.  Ext: 3 + edema up to thighs, bilaterally. 4 x 5 inch irregular blister over right lower leg with tenderness and redness. CNS: Cranial nerves grossly intact. Moves all 4 extremities. Skin: Warm and dry.  Assessment/Plan Right leg cellulitis Bilateral leg edema Chronic diastolic heart failure. Hypertension Obesity Thrombocytopenia Neutropenia  Admit/IV antibiotic/Home medications Continue DNR.   Birdie Riddle, MD  09/23/2014, 10:26 PM

## 2014-09-24 LAB — BASIC METABOLIC PANEL
ANION GAP: 12 (ref 5–15)
BUN: 16 mg/dL (ref 6–23)
CHLORIDE: 106 mmol/L (ref 96–112)
CO2: 24 mmol/L (ref 19–32)
Calcium: 9.1 mg/dL (ref 8.4–10.5)
Creatinine, Ser: 1.06 mg/dL (ref 0.50–1.35)
GFR, EST AFRICAN AMERICAN: 70 mL/min — AB (ref >90)
GFR, EST NON AFRICAN AMERICAN: 60 mL/min — AB (ref >90)
Glucose, Bld: 66 mg/dL — ABNORMAL LOW (ref 70–99)
Potassium: 3.6 mmol/L (ref 3.5–5.1)
SODIUM: 142 mmol/L (ref 135–145)

## 2014-09-24 LAB — CBC
HEMATOCRIT: 35.3 % — AB (ref 39.0–52.0)
HEMOGLOBIN: 12.3 g/dL — AB (ref 13.0–17.0)
MCH: 28.7 pg (ref 26.0–34.0)
MCHC: 34.8 g/dL (ref 30.0–36.0)
MCV: 82.3 fL (ref 78.0–100.0)
PLATELETS: 43 10*3/uL — AB (ref 150–400)
RBC: 4.29 MIL/uL (ref 4.22–5.81)
RDW: 18.5 % — AB (ref 11.5–15.5)
WBC: 2.3 10*3/uL — ABNORMAL LOW (ref 4.0–10.5)

## 2014-09-24 MED ORDER — SODIUM CHLORIDE 0.9 % IV BOLUS (SEPSIS)
150.0000 mL | Freq: Once | INTRAVENOUS | Status: AC
  Administered 2014-09-25: 150 mL via INTRAVENOUS

## 2014-09-24 NOTE — Progress Notes (Signed)
Patient received to room 32 from ED via stretcher, transferred to bed, SR up, call bell in reach. Noted large blister to right lower leg open and draining large amount serous drg. Area cleansed, xeroform, ABD and kerlix applied for protection. Noted healing scabbed blister to left leg shin area, noted small fluid filled blister to mid left lower leg. Both lower ext 4+ edema. Admission paperwork./assessment complete. Patient oriented to room, call bell, bed controls, patient guide book and all orders with verbal understanding. Bed alarm set and working properly. Denies pain at this time. Patient instructed to call before trying to get OOB with verbal understanding. Refusing to watch safety video at this time. Will monitor.

## 2014-09-24 NOTE — Consult Note (Signed)
WOC wound consult note Reason for Consult: Bilateral LE edema with large ruptured blister on RLE. [facility]. If you agree, please reorder/reinitiate services upon discharge. Wound type:Venous insufficiency Pressure Ulcer POA: No Measurement:Anterior aspect of RLE with large ruptured blister, non-viable tissue is rolled to one side and fluid is trapped beneath it.  Loose, hanging, non-viable is trimmed away using sterile scissors to reveal a wet, pink wound bed measuring 16 x 15 x 0.2cm with a 5cm x 4cm discolored area in the center of the pretibial area. LLE is also edematous, but a previously blistered area is cleansed away to reveal a clean and completely healed scar. Wound bed:As described above. Drainage (amount, consistency, odor) Serous drainage from RLE in large amounts Periwound: edematous, mild periwound warmth Dressing procedure/placement/frequency: Today I will implement a twice daily wound care regimen consisting of cleansing with NS, covered te exposed dermis with two layers of xeroform gauze and topping with an ABD (abdorbant) pad prior to wrapping with kerlix roll gauze from toe to knee and topping that with a 6-inch ACE bandage.  Patient is to elevate the LEs while in bed or chair.  Additionally, I have provided bilateral pressure redistribution boots to counter the increased weight and pressure from the edematous LEs on the heels. WOC nursing team will not follow, but will remain available to this patient, the nursing and medical team.  Please re-consult if needed. Thanks, Ladona MowLaurie Geovonni Meyerhoff, MSN, RN, GNP, [place], CWON-AP [phone])

## 2014-09-24 NOTE — Progress Notes (Signed)
Ref: [name],Navayah Sok S, MD   Subjective:  Tolerated diuresis yesterday. Afebrile.  Objective:  Vital Signs in the last 24 hours: Temp:  [98.3 F (36.8 C)-98.6 F (37 C)] 98.6 F (37 C) (04/24 0552) Pulse Rate:  [69-98] 69 (04/24 0552) Cardiac Rhythm:  [-]  Resp:  [13-21] 19 (04/24 0552) BP: (86-113)/(59-84) 95/59 mmHg (04/24 0552) SpO2:  [99 %-100 %] 100 % (04/24 0552) Weight:  [96.8 kg (213 lb 6.5 oz)-97.4 kg (214 lb 11.7 oz)] 96.8 kg (213 lb 6.5 oz) (04/24 0551)  Physical Exam: BP Readings from Last 1 Encounters:  09/24/14 95/59    Wt Readings from Last 1 Encounters:  09/24/14 96.8 kg (213 lb 6.5 oz)    Weight change:   HEENT: [place]/AT, Eyes-Brown, PERL, EOMI, Conjunctiva-Pink, Sclera-Non-icteric Neck: No JVD, No bruit, Trachea midline. Lungs:  Clear, Bilateral. Cardiac:  Regular rhythm, normal S1 and S2, no S3. III/VI systolic murmur. Abdomen:  Soft, non-tender. Extremities:  3 + edema present. No cyanosis. No clubbing. Right leg with dressing. CNS: AxOx3, Cranial nerves grossly intact, moves all 4 extremities.  Skin: Warm and dry.   Intake/Output from previous day: 04/23 0701 - 04/24 0700 In: 50 [IV Piggyback:50] Out: 760 [Urine:760]    Lab Results: BMET    Component Value Date/Time   NA 142 09/24/2014 0524   NA 142 09/23/2014 1802   NA 140 09/20/2014 1609   K 3.6 09/24/2014 0524   K 4.1 09/23/2014 1802   K 5.0 09/20/2014 1609   CL 106 09/24/2014 0524   CL 107 09/23/2014 1802   CL 106 09/20/2014 1609   CO2 24 09/24/2014 0524   CO2 26 09/23/2014 1802   CO2 19 09/20/2014 1609   GLUCOSE 66* 09/24/2014 0524   GLUCOSE 87 09/23/2014 1802   GLUCOSE 59* 09/20/2014 1609   BUN 16 09/24/2014 0524   BUN 18 09/23/2014 1802   BUN 31* 09/20/2014 1609   CREATININE 1.06 09/24/2014 0524   CREATININE 1.12 09/23/2014 1802   CREATININE 1.47* 09/20/2014 1609   CALCIUM 9.1 09/24/2014 0524   CALCIUM 9.9 09/23/2014 1802   CALCIUM 10.3 09/20/2014 1609   GFRNONAA 60*  09/24/2014 0524   GFRNONAA 56* 09/23/2014 1802   GFRNONAA 40* 09/20/2014 1609   GFRAA 70* 09/24/2014 0524   GFRAA 65* 09/23/2014 1802   GFRAA 47* 09/20/2014 1609   CBC    Component Value Date/Time   WBC 2.3* 09/24/2014 0524   RBC 4.29 09/24/2014 0524   HGB 12.3* 09/24/2014 0524   HCT 35.3* 09/24/2014 0524   PLT 43* 09/24/2014 0524   MCV 82.3 09/24/2014 0524   MCH 28.7 09/24/2014 0524   MCHC 34.8 09/24/2014 0524   RDW 18.5* 09/24/2014 0524   LYMPHSABS 0.6* 09/23/2014 1802   MONOABS 0.4 09/23/2014 1802   EOSABS 0.0 09/23/2014 1802   BASOSABS 0.0 09/23/2014 1802   HEPATIC Function Panel  Recent Labs  07/04/14 1530 09/23/14 1802  PROT 7.3 8.1   HEMOGLOBIN A1C No components found for: HGA1C,  MPG CARDIAC ENZYMES Lab Results  Component Value Date   CKTOTAL 63 09/16/2007   CKMB 2.4 09/16/2007   TROPONINI 0.03 09/20/2014   TROPONINI <0.03 08/10/2014   TROPONINI <0.03 07/05/2014   BNP No results for input(s): PROBNP in the last 8760 hours. TSH  Recent Labs  07/04/14 1530 08/10/14 1523 09/20/14 1725  TSH 3.759 3.419 3.212   CHOLESTEROL No results for input(s): CHOL in the last 8760 hours.  Scheduled Meds: . carvedilol  3.125 mg  Oral BID WC  . cefTRIAXone (ROCEPHIN)  IV  1 g Intravenous Q24H  . docusate sodium  100 mg Oral BID  . doxazosin  4 mg Oral Daily  . lisinopril  5 mg Oral Daily  . multivitamin with minerals  1 tablet Oral Daily  . pantoprazole  40 mg Oral Daily  . polyvinyl alcohol  1 drop Both Eyes TID   Continuous Infusions:  PRN Meds:.acetaminophen **OR** acetaminophen, alum & mag hydroxide-simeth, ondansetron **OR** ondansetron (ZOFRAN) IV  Assessment/Plan: Right leg cellulitis Bilateral leg edema Chronic diastolic heart failure. Hypertension Obesity Thrombocytopenia Neutropenia  Continue medical treatment.     LOS: 1 day    Orpah Cobb  MD  09/24/2014, 10:03 AM

## 2014-09-24 NOTE — Progress Notes (Signed)
NT notified RN of low BP and HR of 45. RN rechecked BP and HR. Notified MD of results. New orders received and implemented. Will continue to monitor.

## 2014-09-25 MED ORDER — SULFAMETHOXAZOLE-TRIMETHOPRIM 800-160 MG PO TABS
1.0000 | ORAL_TABLET | Freq: Two times a day (BID) | ORAL | Status: DC
  Administered 2014-09-25 – 2014-09-26 (×3): 1 via ORAL
  Filled 2014-09-25 (×6): qty 1

## 2014-09-25 MED ORDER — LORAZEPAM 1 MG PO TABS
1.0000 mg | ORAL_TABLET | Freq: Once | ORAL | Status: DC
  Filled 2014-09-25 (×2): qty 1

## 2014-09-25 MED ORDER — LISINOPRIL 2.5 MG PO TABS
2.5000 mg | ORAL_TABLET | Freq: Every day | ORAL | Status: DC
  Filled 2014-09-25 (×2): qty 1

## 2014-09-25 NOTE — Progress Notes (Signed)
Dr. Malen GauzeKadekia called and aware that pt put there morning meds in there mouth and then spit them out and refused to take,

## 2014-09-25 NOTE — Progress Notes (Signed)
Reviewed pt's trending bp's and hr's with Dr. Algie CofferKadakia. Dr stated ok to give lisopril and carvadol waiting at least 2 hrs inbetween each, reprto to dr if bP FALLS BELOW 90/50, OR HR BELOW 60

## 2014-09-25 NOTE — Progress Notes (Signed)
Pt states he does not want an IV, he states "I'm going to die anyway, I'm not getting better so there's no point in putting anything in my body". I told the patient we would respect his request.  I informed Counselling psychologistcott RN of this pt's request. Consuello Masseimmons, Adilson Grafton M

## 2014-09-25 NOTE — Progress Notes (Signed)
Ref: Ricki Rodriguez, MD   Subjective:  Feeling better. Agitated earlier. Pulled IV line.  Objective:  Vital Signs in the last 24 hours: Temp:  [98 F (36.7 C)-98.6 F (37 C)] 98.5 F (36.9 C) (04/25 0644) Pulse Rate:  [44-94] 91 (04/25 1309) Cardiac Rhythm:  [-]  Resp:  [12-18] 12 (04/25 1309) BP: (86-117)/(44-87) 89/58 mmHg (04/25 1309) SpO2:  [99 %-100 %] 100 % (04/25 1309) Weight:  [97 kg (213 lb 13.5 oz)] 97 kg (213 lb 13.5 oz) (04/25 0500)  Physical Exam: BP Readings from Last 1 Encounters:  09/25/14 89/58    Wt Readings from Last 1 Encounters:  09/25/14 97 kg (213 lb 13.5 oz)    Weight change: -0.4 kg (-14.1 oz)  HEENT: [place]/AT, Eyes-Brown, PERL, EOMI, Conjunctiva-Pink, Sclera-Non-icteric Neck: No JVD, No bruit, Trachea midline. Lungs:  Clear, Bilateral. Cardiac:  Regular rhythm, normal S1 and S2, no S3. III/VI systolic murmur Abdomen:  Soft, non-tender. Extremities:  3 + edema with right leg wound dressing present. No cyanosis. No clubbing. CNS: AxOx3, Cranial nerves grossly intact, moves all 4 extremities. Right handed. Skin: Warm and dry.   Intake/Output from previous day: 04/24 0701 - 04/25 0700 In: 530 [P.O.:480; IV Piggyback:50] Out: 200 [Urine:200]    Lab Results: BMET    Component Value Date/Time   NA 142 09/24/2014 0524   NA 142 09/23/2014 1802   NA 140 09/20/2014 1609   K 3.6 09/24/2014 0524   K 4.1 09/23/2014 1802   K 5.0 09/20/2014 1609   CL 106 09/24/2014 0524   CL 107 09/23/2014 1802   CL 106 09/20/2014 1609   CO2 24 09/24/2014 0524   CO2 26 09/23/2014 1802   CO2 19 09/20/2014 1609   GLUCOSE 66* 09/24/2014 0524   GLUCOSE 87 09/23/2014 1802   GLUCOSE 59* 09/20/2014 1609   BUN 16 09/24/2014 0524   BUN 18 09/23/2014 1802   BUN 31* 09/20/2014 1609   CREATININE 1.06 09/24/2014 0524   CREATININE 1.12 09/23/2014 1802   CREATININE 1.47* 09/20/2014 1609   CALCIUM 9.1 09/24/2014 0524   CALCIUM 9.9 09/23/2014 1802   CALCIUM 10.3 09/20/2014  1609   GFRNONAA 60* 09/24/2014 0524   GFRNONAA 56* 09/23/2014 1802   GFRNONAA 40* 09/20/2014 1609   GFRAA 70* 09/24/2014 0524   GFRAA 65* 09/23/2014 1802   GFRAA 47* 09/20/2014 1609   CBC    Component Value Date/Time   WBC 2.3* 09/24/2014 0524   RBC 4.29 09/24/2014 0524   HGB 12.3* 09/24/2014 0524   HCT 35.3* 09/24/2014 0524   PLT 43* 09/24/2014 0524   MCV 82.3 09/24/2014 0524   MCH 28.7 09/24/2014 0524   MCHC 34.8 09/24/2014 0524   RDW 18.5* 09/24/2014 0524   LYMPHSABS 0.6* 09/23/2014 1802   MONOABS 0.4 09/23/2014 1802   EOSABS 0.0 09/23/2014 1802   BASOSABS 0.0 09/23/2014 1802   HEPATIC Function Panel  Recent Labs  07/04/14 1530 09/23/14 1802  PROT 7.3 8.1   HEMOGLOBIN A1C No components found for: HGA1C,  MPG CARDIAC ENZYMES Lab Results  Component Value Date   CKTOTAL 63 09/16/2007   CKMB 2.4 09/16/2007   TROPONINI 0.03 09/20/2014   TROPONINI <0.03 08/10/2014   TROPONINI <0.03 07/05/2014   BNP No results for input(s): PROBNP in the last 8760 hours. TSH  Recent Labs  07/04/14 1530 08/10/14 1523 09/20/14 1725  TSH 3.759 3.419 3.212   CHOLESTEROL No results for input(s): CHOL in the last 8760 hours.  Scheduled Meds: .  carvedilol  3.125 mg Oral BID WC  . docusate sodium  100 mg Oral BID  . doxazosin  4 mg Oral Daily  . [START ON 09/26/2014] lisinopril  2.5 mg Oral Daily  . LORazepam  1 mg Oral Once  . multivitamin with minerals  1 tablet Oral Daily  . pantoprazole  40 mg Oral Daily  . polyvinyl alcohol  1 drop Both Eyes TID  . sulfamethoxazole-trimethoprim  1 tablet Oral Q12H   Continuous Infusions:  PRN Meds:.acetaminophen **OR** acetaminophen, alum & mag hydroxide-simeth, ondansetron **OR** ondansetron (ZOFRAN) IV  Assessment/Plan: Right leg cellulitis Bilateral leg edema Chronic diastolic heart failure. Hypertension Obesity Thrombocytopenia Neutropenia Anxiety Senile dementia  Nursing home placement till leg wound heals, since wife is  too old to take care of him.     LOS: 2 days    Orpah CobbAjay Lawernce Earll  MD  09/25/2014, 1:12 PM

## 2014-09-25 NOTE — Progress Notes (Signed)
Pt put there morning meds in there mouth and then spit them out in his hand and refused to take, nurse explained the importance of these meds and asked why pt didn't want med, pt stated they just didn't want to take them, will make dr aware

## 2014-09-26 NOTE — Evaluation (Signed)
Occupational Therapy Evaluation Patient Details Name: [name] MRN: [id number] DOB: [birth year] Today's Date: 09/26/2014    History of Present Illness Patient is an 81 year old male with recurrent leg edema has blisters and swelling of right leg more than left one. He had sepsis affecting skin infection of lower legs 3 days ago and asked for discharge home with oral antibiotics. He has history of hypertension, GERD and CHF along with thrombocytopenia and neutropenia. Pt with senile dementia.   Clinical Impression   Pt admitted with above. Per pt report, his wife is not at home 24/7 (pt unable to tell me where she was during evaluation). Recommending SNF for rehab unless wife can provide adequate 24/7 assist.    Follow Up Recommendations  SNF;Supervision/Assistance - 24 hour    Equipment Recommendations  Other (comment) (defer to next venue)    Recommendations for Other Services       Precautions / Restrictions Precautions Precautions: Fall Restrictions Weight Bearing Restrictions: No      Mobility Bed Mobility     General bed mobility comments: not assessed  Transfers Overall transfer level: Needs assistance Transfers: Sit to/from Stand Sit to Stand: Min guard         General transfer comment: cues for hand placement/technique         ADL Overall ADL's : Needs assistance/impaired     Grooming: Wash/dry face;Wash/dry hands;Set up;Supervision/safety;Standing   Upper Body Bathing: Set up;Sitting   Lower Body Bathing: Min guard;Sit to/from stand   Upper Body Dressing : Set up;Sitting   Lower Body Dressing: Min guard;Sit to/from stand   Toilet Transfer: Min guard;Ambulation;RW (chair)           Functional mobility during ADLs: Min guard;Rolling walker General ADL Comments: Pt able to don/doff socks but was effortful.      Vision     Perception     Praxis      Pertinent Vitals/Pain Pain Assessment: 0-10 Pain Score: 9  (his face did not  match pain rating) Pain Location: RLE (reported pain in RLE when touched) Pain Descriptors / Indicators: Throbbing Pain Intervention(s): Repositioned     Hand Dominance     Extremity/Trunk Assessment Upper Extremity Assessment Upper Extremity Assessment: Generalized weakness;RUE deficits/detail RUE Deficits / Details: 2+/5 shoulder flexion   Lower Extremity Assessment Lower Extremity Assessment: Defer to PT evaluation       Communication Communication Communication: No difficulties   Cognition Arousal/Alertness: Awake/alert Behavior During Therapy: WFL for tasks assessed/performed Overall Cognitive Status: History of cognitive impairments - at baseline (and no family present to determine baseline)             General Comments       Exercises       Shoulder Instructions      Home Living Family/patient expects to be discharged to:: Private residence Living Arrangements: Spouse/significant other Available Help at Discharge: Family (per pt, intermittent) Type of Home: House Home Access: Stairs to enter [employer] of Steps: 2-4   Home Layout: One level     Bathroom Shower/Tub: Chief Strategy Officer: Handicapped height     Home Equipment: Cane - single point   Additional Comments: some information taken from previous admission      Prior Functioning/Environment Level of Independence: Needs assistance    ADL's / Homemaking Assistance Needed: per pt, his wife was assisting him lately with bathing/dressing        OT Diagnosis: Generalized weakness;Acute pain   OT  Problem List: Pain;Decreased knowledge of precautions;Decreased knowledge of use of DME or AE;Decreased cognition;Decreased strength;Increased edema;Decreased range of motion   OT Treatment/Interventions: Self-care/ADL training;Therapeutic exercise;DME and/or AE instruction;Therapeutic activities;Cognitive remediation/compensation;Patient/family education;Balance training     OT Goals(Current goals can be found in the care plan section) Acute Rehab OT Goals Patient Stated Goal: wants to get moving around and back to walking OT Goal Formulation: With patient Time For Goal Achievement: 10/03/14 Potential to Achieve Goals: Good ADL Goals Pt Will Perform Lower Body Bathing: with set-up;sit to/from stand;with supervision Pt Will Perform Lower Body Dressing: with set-up;with supervision;sit to/from stand Pt Will Transfer to Toilet: with supervision;ambulating Additional ADL Goal #1: Pt will independently perform HEP to increase strength in bilateral UEs.  OT Frequency: Min 2X/week   Barriers to D/C:            Co-evaluation              End of Session Equipment Utilized During Treatment: Gait belt;Rolling walker  Activity Tolerance: Patient tolerated treatment well Patient left: in chair;with call bell/phone within reach;with chair alarm set;Other (comment) (with prevalon boots reapplied at end of session)   Time: [id number] OT Time Calculation (min): 18 min Charges:  OT General Charges $OT Visit: 1 Procedure OT Evaluation $Initial OT Evaluation Tier I: 1 Procedure G-CodesEarlie Raveling:    Toniya Rozar L OTR/L [phone] 09/26/2014, 4:41 PM

## 2014-09-26 NOTE — Evaluation (Signed)
Physical Therapy Evaluation Patient Details Name: TITAN KARNER MRN: [id number] DOB: Oct 19, 1922 Today's Date: 09/26/2014   History of Present Illness  Patient is an 81 year old male with recurrent leg edema has blisters and swelling of right leg more than left one. He had sepsis affecting skin infection of lower legs 3 days ago and asked for discharge home with oral antibiotics. He has history of hypertension, GERD and CHF along with thrombocytopenia and neutropenia.  Clinical Impression  Patient presents with decreased independence with mobility due to deficits listed in PT problem list.  He reports that he has a wife, but seems unaware of where she is.  If no one is available to help at d/c may need SNF rehab.  Will follow along and determine safest d/c environment based on level of home support.  Patient seemed to think his family felt he was a burden and might not want him to come home.     Follow Up Recommendations Supervision/Assistance - 24 hour;Home health PT;SNF    Equipment Recommendations  None recommended by PT    Recommendations for Other Services       Precautions / Restrictions Precautions Precautions: Fall      Mobility  Bed Mobility Overal bed mobility: Needs Assistance Bed Mobility: Supine to Sit     Supine to sit: Min assist     General bed mobility comments: assist to lift trunk upright  Transfers Overall transfer level: Needs assistance Equipment used: Rolling walker (2 wheeled) Transfers: Sit to/from Stand Sit to Stand: Min assist;Mod assist         General transfer comment: increased time, cues for hand placement; increased assist to lower slowly to [facility]  Ambulation/Gait Ambulation/Gait assistance: Min guard;Min assist Ambulation Distance (Feet): 250 Feet Assistive device: Rolling walker (2 wheeled) Gait Pattern/deviations: Step-through pattern;Decreased stride length   Gait velocity interpretation: Below normal speed for age/gender General  Gait Details: slow pace with visible weakness/shaking in arms, seemed to improve with increase distance  Stairs            Wheelchair Mobility    Modified Rankin (Stroke Patients Only)       Balance Overall balance assessment: Needs assistance         Standing balance support: Bilateral upper extremity supported Standing balance-Leahy Scale: Poor Standing balance comment: needed UE support for balance                             Pertinent Vitals/Pain Pain Assessment: 0-10 Pain Score: 7  Pain Location: bilateral LE's with weight bearing Pain Intervention(s): Monitored during session    Home Living Family/patient expects to be discharged to:: Private residence Living Arrangements: Spouse/significant other Available Help at Discharge: Family Type of Home: House                Prior Function                 Hand Dominance        Extremity/Trunk Assessment               Lower Extremity Assessment: Generalized weakness         Communication      Cognition Arousal/Alertness: Awake/alert Behavior During Therapy: WFL for tasks assessed/performed Overall Cognitive Status: No family/caregiver present to determine baseline cognitive functioning Area of Impairment: Problem solving;Safety/judgement         Safety/Judgement: Decreased awareness of deficits   Problem Solving: Slow  processing;Requires verbal cues      General Comments      Exercises        Assessment/Plan    PT Assessment Patient needs continued PT services  PT Diagnosis Difficulty walking;Acute pain   PT Problem List Decreased strength;Decreased mobility;Decreased safety awareness;Decreased activity tolerance;Decreased balance;Pain;Decreased knowledge of use of DME;Cardiopulmonary status limiting activity;Decreased cognition  PT Treatment Interventions DME instruction;Therapeutic exercise;Gait training;Balance training;Functional mobility  training;Therapeutic activities;Patient/family education;Stair training   PT Goals (Current goals can be found in the Care Plan section) Acute Rehab PT Goals Patient Stated Goal: to go home PT Goal Formulation: With patient Time For Goal Achievement: 10/03/14 Potential to Achieve Goals: Good    Frequency Min 3X/week   Barriers to discharge Other (comment) unclear amount of support available at home    Co-evaluation               End of Session Equipment Utilized During Treatment: Gait belt Activity Tolerance: Patient tolerated treatment well Patient left: with call bell/phone within reach;in chair;with chair alarm set           Time: [id number] PT Time Calculation (min) (ACUTE ONLY): 36 min   Charges:   PT Evaluation $Initial PT Evaluation Tier I: 1 Procedure PT Treatments $Gait Training: 8-22 mins   PT G Codes:        WYNN,CYNDI 09/26/2014, 3:09 PM  Sheran Lawlessyndi Wynn, PT [phone] 09/26/2014

## 2014-09-26 NOTE — Progress Notes (Signed)
Patient refused for RN to perform dressing changes this morning.

## 2014-09-26 NOTE — Progress Notes (Signed)
Patient refused to take his medications. Pt's wife at bedside. Pt stated that "i don't need to take them." Pt drifts does not respond to staff when spoken to, he just drifts off to sleep and says he doesn't want his medications. Will continue to monitor patient.

## 2014-09-26 NOTE — Progress Notes (Signed)
Pt a/o x 2, call 911 x 3, stated "I want you come and talk to me". Sitting up in recliner, no distress, denies pain, BLE elevated, ortho boot intact. Security and police officers x 2 at bedside. Pt refusing to get in bed at this time. Will monitor during hs, paged for sitter completed per charge nurse.

## 2014-09-26 NOTE — Progress Notes (Signed)
Ref: [name],[name], MD   Subjective:  Feeling better. Afebrile. Some what confused and irritable. Non-compliant on medication intake at times.  Objective:  Vital Signs in the last 24 hours: Temp:  [98.1 F (36.7 C)] 98.1 F (36.7 C) (04/26 0617) Pulse Rate:  [90-92] 92 (04/26 0617) Cardiac Rhythm:  [-]  Resp:  [12-18] 18 (04/26 0617) BP: (89-116)/(58-77) 116/76 mmHg (04/26 0617) SpO2:  [99 %-100 %] 100 % (04/26 0617) Weight:  [98.7 kg (217 lb 9.5 oz)] 98.7 kg (217 lb 9.5 oz) (04/26 0500)  Physical Exam: BP Readings from Last 1 Encounters:  09/26/14 116/76    Wt Readings from Last 1 Encounters:  09/26/14 98.7 kg (217 lb 9.5 oz)    Weight change: 1.7 kg (3 lb 12 oz)  HEENT: [place]/AT, Eyes-Brown, PERL, EOMI, Conjunctiva-Pink, Sclera-Non-icteric Neck: No JVD, No bruit, Trachea midline. Lungs:  Clear, Bilateral. Cardiac:  Regular rhythm, normal S1 and S2, no S3. III/VI systolic murmur Abdomen:  Soft, non-tender. Extremities:  3 + edema present. No cyanosis. No clubbing. Right lower leg dressing on CNS: AxOx1, Cranial nerves grossly intact, moves all 4 extremities. Right handed. Skin: Warm and dry.   Intake/Output from previous day: 04/25 0701 - 04/26 0700 In: 0  Out: 375 [Urine:375]    Lab Results: BMET    Component Value Date/Time   NA 142 09/24/2014 0524   NA 142 09/23/2014 1802   NA 140 09/20/2014 1609   K 3.6 09/24/2014 0524   K 4.1 09/23/2014 1802   K 5.0 09/20/2014 1609   CL 106 09/24/2014 0524   CL 107 09/23/2014 1802   CL 106 09/20/2014 1609   CO2 24 09/24/2014 0524   CO2 26 09/23/2014 1802   CO2 19 09/20/2014 1609   GLUCOSE 66* 09/24/2014 0524   GLUCOSE 87 09/23/2014 1802   GLUCOSE 59* 09/20/2014 1609   BUN 16 09/24/2014 0524   BUN 18 09/23/2014 1802   BUN 31* 09/20/2014 1609   CREATININE 1.06 09/24/2014 0524   CREATININE 1.12 09/23/2014 1802   CREATININE 1.47* 09/20/2014 1609   CALCIUM 9.1 09/24/2014 0524   CALCIUM 9.9 09/23/2014 1802   CALCIUM  10.3 09/20/2014 1609   GFRNONAA 60* 09/24/2014 0524   GFRNONAA 56* 09/23/2014 1802   GFRNONAA 40* 09/20/2014 1609   GFRAA 70* 09/24/2014 0524   GFRAA 65* 09/23/2014 1802   GFRAA 47* 09/20/2014 1609   CBC    Component Value Date/Time   WBC 2.3* 09/24/2014 0524   RBC 4.29 09/24/2014 0524   HGB 12.3* 09/24/2014 0524   HCT 35.3* 09/24/2014 0524   PLT 43* 09/24/2014 0524   MCV 82.3 09/24/2014 0524   MCH 28.7 09/24/2014 0524   MCHC 34.8 09/24/2014 0524   RDW 18.5* 09/24/2014 0524   LYMPHSABS 0.6* 09/23/2014 1802   MONOABS 0.4 09/23/2014 1802   EOSABS 0.0 09/23/2014 1802   BASOSABS 0.0 09/23/2014 1802   HEPATIC Function Panel  Recent Labs  07/04/14 1530 09/23/14 1802  PROT 7.3 8.1   HEMOGLOBIN A1C No components found for: HGA1C,  MPG CARDIAC ENZYMES Lab Results  Component Value Date   CKTOTAL 63 09/16/2007   CKMB 2.4 09/16/2007   TROPONINI 0.03 09/20/2014   TROPONINI <0.03 08/10/2014   TROPONINI <0.03 07/05/2014   BNP No results for input(Kaufman): PROBNP in the last 8760 hours. TSH  Recent Labs  07/04/14 1530 08/10/14 1523 09/20/14 1725  TSH 3.759 3.419 3.212   CHOLESTEROL No results for input(Kaufman): CHOL in the last 8760 hours.  Scheduled Meds: . carvedilol  3.125 mg Oral BID WC  . docusate sodium  100 mg Oral BID  . doxazosin  4 mg Oral Daily  . lisinopril  2.5 mg Oral Daily  . LORazepam  1 mg Oral Once  . multivitamin with minerals  1 tablet Oral Daily  . pantoprazole  40 mg Oral Daily  . polyvinyl alcohol  1 drop Both Eyes TID  . sulfamethoxazole-trimethoprim  1 tablet Oral Q12H   Continuous Infusions:  PRN Meds:.acetaminophen **OR** acetaminophen, alum & mag hydroxide-simeth, ondansetron **OR** ondansetron (ZOFRAN) IV  Assessment/Plan: Right leg cellulitis Bilateral leg edema Chronic diastolic heart failure. Hypertension Obesity Thrombocytopenia Neutropenia Anxiety Senile dementia  PT/OT eval and treat.  Awaiting NH placement.     LOS: 3  days    Orpah Cobb  MD  09/26/2014, 10:07 AM

## 2014-09-26 NOTE — Progress Notes (Signed)
Pt is refusing some care at different times including meds, eating, and dressing chages. Sometimes will start to take pills and then  [facility]

## 2014-09-27 LAB — CULTURE, BLOOD (ROUTINE X 2): CULTURE: NO GROWTH

## 2014-09-27 MED ORDER — LISINOPRIL 2.5 MG PO TABS: 2.5000 mg | ORAL_TABLET | Freq: Every day | ORAL | Status: AC

## 2014-09-27 MED ORDER — SULFAMETHOXAZOLE-TRIMETHOPRIM 800-160 MG PO TABS: 1.0000 | ORAL_TABLET | Freq: Two times a day (BID) | ORAL | Status: AC

## 2014-09-27 NOTE — Clinical Social Work Note (Signed)
CSW from yesterday and today has attempted to reach wife by phone 6 times. CSW is getting no answer. [facility] and CSW has asked facility to start authorization for patient. CSW cannot send the patient to SNF without speaking to the wife and coordinating the discharge.  Roddie McBryant Neela Zecca MSW, [place], [place], [phone]

## 2014-09-27 NOTE — Clinical Social Work Placement (Addendum)
   CLINICAL SOCIAL WORK PLACEMENT  NOTE  Date:  09/27/2014  Patient Details  Name: John ContrasFrank R Mcginnis MRN: [id number] Date of Birth: 06/20/1922  Clinical Social Work is seeking post-discharge placement for this patient at the Skilled  Nursing Facility level of care (*CSW will initial, date and re-position this form in  chart as items are completed):  Yes   Patient/family provided with [facility] Clinical Social Work Department's list of facilities offering this level of care within the geographic area requested by the patient (or if unable, by the patient's family).  Yes   Patient/family informed of their freedom to choose among providers that offer the needed level of care, that participate in Medicare, Medicaid or managed care program needed by the patient, have an available bed and are willing to accept the patient.  Yes   Patient/family informed of [facility]'s ownership interest in [facility] and [facility], as well as of the fact that they are under no obligation to receive care at these facilities.  PASRR submitted to EDS on 09/27/14     PASRR number received on 09/27/14     Existing PASRR number confirmed on       FL2 transmitted to all facilities in geographic area requested by pt/family on 09/27/14     FL2 transmitted to all facilities within larger geographic area on       Patient informed that his/her managed care company has contracts with or will negotiate with certain facilities, including the following:        Yes   Patient/family informed of bed offers received.  [facility]     Physician recommends and patient chooses bed at      [facility].  Patient to be transferred to facility by Ambulance     Patient family notified on 09/27/14 of transfer.  Name of family member notified:  Rhunette CroftMildred     PHYSICIAN       Additional Comment:   Per MD patient  [facility]. RN, patient, patient's family, and facility notified of DC. RN given number for report. DC packet on chart. Ambulance transport to be requested for patient by RN for 7:30PM per wife's request. CSW explained to wife that family will likely have to wait for transport as EMS gets backed up in the evening. CSW signing off.  _______________________________________________ Venita Lickampbell, Kaytie Ratcliffe B, LCSW 09/27/2014, 2:57 PM

## 2014-09-27 NOTE — Clinical Social Work Note (Signed)
Clinical Social Work Assessment  Patient Details  Name: [name] MRN: [id number] Date of Birth: 12/21/1922  Date of referral:  09/27/14               Reason for consult:  Discharge Planning                Permission sought to share information with:  Facility Medical sales representativeContact Representative, Family Supports Permission granted to share information::  Yes, Verbal Permission Granted  Name::     Audiological scientistMildred   Agency::     Relationship::     Contact Information:     Housing/Transportation Living arrangements for the past 2 months:  Single Family Home Source of Information:  Spouse Patient Interpreter Needed:  None Criminal Activity/Legal Involvement Pertinent to Current Situation/Hospitalization:  No - Comment as needed Significant Relationships:  Spouse Lives with:  Spouse Do you feel safe going back to the place where you live?    Need for family participation in patient care:  Yes (Comment)  Care giving concerns:  Patient's wife is very concerned about the patient. As the primary caregiver of the patient at home, she has seen a decline in the patient's condition over the past year. Patient's wife does not feel she can manage her husband at home by herself at this time.   Social Worker assessment / plan:  CSW explained recommendation for SNF to patient's wife and the patient. Both are agreeable to SNF placement at discharge. CSW provided SNF list and answered questions. Patient's wife insists that these be only for short term rehab as she wants him to return home as soon as possible.  Employment status:  Retired Health and safety inspectornsurance information:    PT Recommendations:  Skilled Teacher, early years/preursing Facility Information / Referral to community resources:  Skilled Nursing Facility  Patient/Family's Response to care:  Family is happy that patient will be placed in SNF at discharge as they feel this will provide the patient with what he needs.  Patient/Family's Understanding of and Emotional Response to Diagnosis,  Current Treatment, and Prognosis:  Patients wife does seem to be a bit overwhelmed by her husbands decline, she is visibly upset by his refusal of care.   Emotional Assessment Appearance:  Appears stated age Attitude/Demeanor/Rapport:  Guarded, Paranoid Affect (typically observed):  Flat, Calm, Quiet Orientation:  Oriented to Self, Oriented to Place, Oriented to  Time Alcohol / Substance use:  Never Used Psych involvement (Current and /or in the community):  No (Comment)  Discharge Needs  Concerns to be addressed:  Discharge Planning Concerns Readmission within the last 30 days:  Yes Current discharge risk:  Physical Impairment, Other (Wife doesn't feel she can manage patient at home.) Barriers to Discharge:  Barriers Resolved   Venita LickCampbell, Prathik Aman B, LCSW 09/27/2014, 5:08 PM

## 2014-09-27 NOTE — Progress Notes (Signed)
[facility]. IV removed, discharge information and instructions given to family. All belongings with patient.

## 2014-09-27 NOTE — Discharge Summary (Signed)
Physician Discharge Summary  Patient ID: Fleet ContrasFrank R Dolinger MRN: [id number] DOB/AGE: 79/07/1922 81 y.o.  Admit date: 09/23/2014 Discharge date: 09/27/2014  Admission Diagnoses: Right leg cellulitis Bilateral leg edema Chronic diastolic heart failure. Hypertension Obesity Thrombocytopenia Neutropenia Anxiety Senile dementia  Discharge Diagnoses:  Principle Problems: * Right leg cellulitis * Bilateral leg edema Chronic diastolic heart failure. Hypertension Obesity Thrombocytopenia Neutropenia Anxiety Senile dementia  Discharged Condition: poor  Hospital Course: 81 year old male with recurrent leg edema has blisters and swelling of right leg more than left one. He had sepsis affecting skin infection of lower legs 3 days ago and asked for discharge home with oral antibiotics. He has history of hypertension, GERD and CHF along with thrombocytopenia and neutropenia. He is not hypothermic but mildly hypotensive on admission and looked sick and said he will stay in hospital to get well. He responded to conservative treatment with IV followed by oral antibiotic. He was agitated at times. He is too weak to go home hence he is sent to SNF till able to improve ambulation.  Consults: None  Significant Diagnostic Studies: labs: Near normal CMET with minimally elevated LFT and hypoalbuminemia. Chronic neutropenia and thrombocytopenia with normal hemoglobin.  Treatments: antibiotics: ceftriaxone and Septra DS.  Discharge Exam: Blood pressure 138/62, pulse 92, temperature 98 F (36.7 C), temperature source Oral, resp. rate 18, height 5\' 7"  (1.702 m), weight 96.1 kg (211 lb 13.8 oz), SpO2 96 %. HEENT: [place]/AT, Eyes-Brown, PERL, EOMI, Conjunctiva-Pink, Sclera-Non-icteric Neck: No JVD, No bruit, Trachea midline. Lungs: Clear, Bilateral. Cardiac: Regular rhythm, normal S1 and S2, no S3. III/VI systolic murmur Abdomen: Soft, non-tender. Extremities: 3 + edema present. No cyanosis. No clubbing.  Right lower leg dressing on CNS: AxOx1, Cranial nerves grossly intact, moves all 4 extremities. Right handed. Skin: Warm and dry.  Disposition: 51-Hospice/Medical Facility     Medication List    TAKE these medications        carvedilol 3.125 MG tablet  Commonly known as:  COREG  Take 1 tablet (3.125 mg total) by mouth 2 (two) times daily with a meal.     doxazosin 2 MG tablet  Commonly known as:  CARDURA  Take 4 mg by mouth daily.     furosemide 20 MG tablet  Commonly known as:  LASIX  Take 10 mg by mouth 2 (two) times a week. Take on Monday and Thursday     lisinopril 2.5 MG tablet  Commonly known as:  PRINIVIL,ZESTRIL  Take 1 tablet (2.5 mg total) by mouth daily.     MULTI COMPLETE PO  Take 1 tablet by mouth as needed (only takes soometimes).     omeprazole 20 MG capsule  Commonly known as:  PRILOSEC  Take 20 mg by mouth as needed (for heartburn).     potassium chloride 10 MEQ tablet  Commonly known as:  K-DUR  Take 10 mEq by mouth 2 (two) times daily.     REFRESH OP  Place 2 drops into both eyes daily as needed. For itching     sulfamethoxazole-trimethoprim 800-160 MG per tablet  Commonly known as:  BACTRIM DS,SEPTRA DS  Take 1 tablet by mouth every 12 (twelve) hours.           Follow-up Information    [facility]ly[facility] Baillargeon S, MD In 2 weeks.   Specialty:  Cardiology   Contact information:   [address] [place] [address] [phone]       Signed: [name],Tahnee Cifuentes S 09/27/2014, 1:15  PM   

## 2014-09-27 NOTE — Clinical Social Work Note (Signed)
Family has chosen [facility] SNF. CSW has made MD aware.   Roddie McBryant Brooklynn Brandenburg MSW, [place] Ridge[place]CSWA, [place], [phone]

## 2014-09-29 ENCOUNTER — Encounter: Payer: Self-pay | Admitting: Adult Health

## 2014-10-03 ENCOUNTER — Non-Acute Institutional Stay (SKILLED_NURSING_FACILITY): Payer: Medicare HMO | Admitting: Internal Medicine

## 2014-10-03 ENCOUNTER — Encounter: Payer: Self-pay | Admitting: Internal Medicine

## 2014-10-03 DIAGNOSIS — D709 Neutropenia, unspecified: Secondary | ICD-10-CM | POA: Diagnosis not present

## 2014-10-03 DIAGNOSIS — D696 Thrombocytopenia, unspecified: Secondary | ICD-10-CM | POA: Diagnosis not present

## 2014-10-03 DIAGNOSIS — I5032 Chronic diastolic (congestive) heart failure: Secondary | ICD-10-CM | POA: Diagnosis not present

## 2014-10-03 DIAGNOSIS — I10 Essential (primary) hypertension: Secondary | ICD-10-CM

## 2014-10-03 DIAGNOSIS — L03115 Cellulitis of right lower limb: Secondary | ICD-10-CM

## 2014-10-03 DIAGNOSIS — F411 Generalized anxiety disorder: Secondary | ICD-10-CM

## 2014-10-03 DIAGNOSIS — R600 Localized edema: Secondary | ICD-10-CM

## 2014-10-03 DIAGNOSIS — F0390 Unspecified dementia without behavioral disturbance: Secondary | ICD-10-CM

## 2014-10-03 NOTE — Progress Notes (Signed)
Patient ID: [name], male   DOB: 03/14/1923, 81 y.o.   MRN: [id number]    HISTORY AND PHYSICAL   DATE: 10/03/14  Location:  [facility]    Place of Service: SNF [id number])   Extended Emergency Contact Information Primary Emergency Contact: Eastham,Mildred Address: [address]          [place], [place] [address] [place] Phone: [phone] Relation: Spouse  Advanced Directive information  FULL CODE  Chief Complaint  Patient presents with  . New Admit To SNF    HPI:  81 yo male seen today as a new admission into SNF following hospital stay for RLE cellulitis, thrombocytopenia, neutropenia, b/l LE edema, chronic diastolic HF, HTN. He was tx with IV then po abx (bactrim) and d/c'd on bactrim which he has completed. He has no c/o today. He is a poor historian due to dementia. Hx obtained from chart.  He has chronic diastolic HF and is stable on coreg, lasix and low dose lisinopril. BP is stable on meds. He is also taking doxazosin in addition to heart meds.   Plts were 43K at d/c. No spontaneous bleeding noted  He takes a potassium supplement due to lasix    Past Medical History  Diagnosis Date  . Hypertension   . GERD (gastroesophageal reflux disease)   . Dry eye   . CHF (congestive heart failure)     Past Surgical History  Procedure Laterality Date  . Cardiac surgery  2008    pt states he had agrowth removed from his heart  . Facial growth      Patient Care Team: Dixie Dials, MD as PCP - General (Cardiology)  History   Social History  . Marital Status: Married    Spouse Name: N/A  . Number of Children: N/A  . Years of Education: N/A   Occupational History  . Not on file.   Social History Main Topics  . Smoking status: Former Research scientist (life sciences)  . Smokeless tobacco: Never Used  . Alcohol Use: No  . Drug Use: No  . Sexual Activity: Not on file   Other Topics Concern  . Not on file   Social History Narrative     reports  that he has quit smoking. He has never used smokeless tobacco. He reports that he does not drink alcohol or use illicit drugs.  No family history on file. No family status information on file.     There is no immunization history on file for this patient.  No Known Allergies  Medications: Patient's Medications  New Prescriptions   No medications on file  Previous Medications   CARVEDILOL (COREG) 3.125 MG TABLET    Take 1 tablet (3.125 mg total) by mouth 2 (two) times daily with a meal.   DOXAZOSIN (CARDURA) 2 MG TABLET    Take 4 mg by mouth daily.   FUROSEMIDE (LASIX) 20 MG TABLET    Take 10 mg by mouth 2 (two) times a week. Take on Monday and Thursday   LISINOPRIL (PRINIVIL,ZESTRIL) 2.5 MG TABLET    Take 1 tablet (2.5 mg total) by mouth daily.   MULTIPLE VITAMINS-MINERALS (MULTI COMPLETE PO)    Take 1 tablet by mouth as needed (only takes soometimes).    OMEPRAZOLE (PRILOSEC) 20 MG CAPSULE    Take 20 mg by mouth as needed (for heartburn).    POLYVINYL ALCOHOL-POVIDONE (REFRESH OP)    Place 2 drops into both eyes daily as needed. For  itching   POTASSIUM CHLORIDE (K-DUR) 10 MEQ TABLET    Take 10 mEq by mouth 2 (two) times daily.   SULFAMETHOXAZOLE-TRIMETHOPRIM (BACTRIM DS,SEPTRA DS) 800-160 MG PER TABLET    Take 1 tablet by mouth every 12 (twelve) hours.  Modified Medications   No medications on file  Discontinued Medications   No medications on file    Review of Systems  Unable to perform ROS: Dementia    Filed Vitals:   10/03/14 1606  BP: 130/78  Pulse: 62  Temp: 98.1 F (36.7 C)  Weight: 217 lb (98.431 kg)  SpO2: 96%   Body mass index is 33.98 kg/(m^2).  Physical Exam  Constitutional: He appears well-developed and well-nourished. No distress.  No conversational dyspnea. Lying in bed  HENT:  Mouth/Throat: Oropharynx is clear and moist.  Eyes: Pupils are equal, round, and reactive to light. No scleral icterus.  Neck: Neck supple. Carotid bruit is not present. No  thyromegaly present.  Cardiovascular: Normal rate, regular rhythm and intact distal pulses.  Exam reveals no gallop and no friction rub.   Murmur (1/6 SEM) heard. +1 pitting LE edema b/l. No calf TTP.  Pulmonary/Chest: Effort normal and breath sounds normal. He has no wheezes. He has no rales. He exhibits no tenderness.  Abdominal: Soft. Bowel sounds are normal. He exhibits no distension, no abdominal bruit, no pulsatile midline mass and no mass. There is no tenderness. There is no rebound and no guarding.  Lymphadenopathy:    He has no cervical adenopathy.  Neurological: He is alert. He has normal reflexes.  Skin: Skin is warm and dry. No rash noted.  RLE wrapped - dressing c/d/i  Psychiatric: He has a normal mood and affect. His behavior is normal.     Labs reviewed: Admission on 09/23/2014, Discharged on 09/27/2014  Component Date Value Ref Range Status  . WBC 09/23/2014 2.9* 4.0 - 10.5 K/uL Final  . RBC 09/23/2014 4.71  4.22 - 5.81 MIL/uL Final  . Hemoglobin 09/23/2014 13.6  13.0 - 17.0 g/dL Final  . HCT 09/23/2014 38.1* 39.0 - 52.0 % Final  . MCV 09/23/2014 80.9  78.0 - 100.0 fL Final  . MCH 09/23/2014 28.9  26.0 - 34.0 pg Final  . MCHC 09/23/2014 35.7  30.0 - 36.0 g/dL Final  . RDW 09/23/2014 18.2* 11.5 - 15.5 % Final  . Platelets 09/23/2014 52* 150 - 400 K/uL Final   Comment: SPECIMEN CHECKED FOR CLOTS REPEATED TO VERIFY PLATELET COUNT CONFIRMED BY SMEAR   . Neutrophils Relative % 09/23/2014 66  43 - 77 % Final  . Neutro Abs 09/23/2014 1.9  1.7 - 7.7 K/uL Final  . Lymphocytes Relative 09/23/2014 19  12 - 46 % Final  . Lymphs Abs 09/23/2014 0.6* 0.7 - 4.0 K/uL Final  . Monocytes Relative 09/23/2014 13* 3 - 12 % Final  . Monocytes Absolute 09/23/2014 0.4  0.1 - 1.0 K/uL Final  . Eosinophils Relative 09/23/2014 1  0 - 5 % Final  . Eosinophils Absolute 09/23/2014 0.0  0.0 - 0.7 K/uL Final  . Basophils Relative 09/23/2014 0  0 - 1 % Final  . Basophils Absolute 09/23/2014 0.0   0.0 - 0.1 K/uL Final  . Sodium 09/23/2014 142  135 - 145 mmol/L Final  . Potassium 09/23/2014 4.1  3.5 - 5.1 mmol/L Final  . Chloride 09/23/2014 107  96 - 112 mmol/L Final  . CO2 09/23/2014 26  19 - 32 mmol/L Final  . Glucose, Bld 09/23/2014 87  70 -  99 mg/dL Final  . BUN 09/23/2014 18  6 - 23 mg/dL Final  . Creatinine, Ser 09/23/2014 1.12  0.50 - 1.35 mg/dL Final  . Calcium 09/23/2014 9.9  8.4 - 10.5 mg/dL Final  . Total Protein 09/23/2014 8.1  6.0 - 8.3 g/dL Final  . Albumin 09/23/2014 3.3* 3.5 - 5.2 g/dL Final  . AST 09/23/2014 44* 0 - 37 U/L Final  . ALT 09/23/2014 24  0 - 53 U/L Final  . Alkaline Phosphatase 09/23/2014 104  39 - 117 U/L Final  . Total Bilirubin 09/23/2014 2.6* 0.3 - 1.2 mg/dL Final  . GFR calc non Af Amer 09/23/2014 56* >90 mL/min Final  . GFR calc Af Amer 09/23/2014 65* >90 mL/min Final   Comment: (NOTE) The eGFR has been calculated using the CKD EPI equation. This calculation has not been validated in all clinical situations. eGFR's persistently <90 mL/min signify possible Chronic Kidney Disease.   . Anion gap 09/23/2014 9  5 - 15 Final  . B Natriuretic Peptide 09/23/2014 200.0* 0.0 - 100.0 pg/mL Final  . WBC 09/24/2014 2.3* 4.0 - 10.5 K/uL Final  . RBC 09/24/2014 4.29  4.22 - 5.81 MIL/uL Final  . Hemoglobin 09/24/2014 12.3* 13.0 - 17.0 g/dL Final  . HCT 09/24/2014 35.3* 39.0 - 52.0 % Final  . MCV 09/24/2014 82.3  78.0 - 100.0 fL Final  . MCH 09/24/2014 28.7  26.0 - 34.0 pg Final  . MCHC 09/24/2014 34.8  30.0 - 36.0 g/dL Final  . RDW 09/24/2014 18.5* 11.5 - 15.5 % Final  . Platelets 09/24/2014 43* 150 - 400 K/uL Final   Comment: REPEATED TO VERIFY CONSISTENT WITH PREVIOUS RESULT   . Sodium 09/24/2014 142  135 - 145 mmol/L Final  . Potassium 09/24/2014 3.6  3.5 - 5.1 mmol/L Final  . Chloride 09/24/2014 106  96 - 112 mmol/L Final  . CO2 09/24/2014 24  19 - 32 mmol/L Final  . Glucose, Bld 09/24/2014 66* 70 - 99 mg/dL Final  . BUN 09/24/2014 16  6 - 23  mg/dL Final  . Creatinine, Ser 09/24/2014 1.06  0.50 - 1.35 mg/dL Final  . Calcium 09/24/2014 9.1  8.4 - 10.5 mg/dL Final  . GFR calc non Af Amer 09/24/2014 60* >90 mL/min Final  . GFR calc Af Amer 09/24/2014 70* >90 mL/min Final   Comment: (NOTE) The eGFR has been calculated using the CKD EPI equation. This calculation has not been validated in all clinical situations. eGFR's persistently <90 mL/min signify possible Chronic Kidney Disease.   . Anion gap 09/24/2014 12  5 - 15 Final  Admission on 09/20/2014, Discharged on 09/21/2014  Component Date Value Ref Range Status  . WBC 09/20/2014 3.0* 4.0 - 10.5 K/uL Final  . RBC 09/20/2014 5.02  4.22 - 5.81 MIL/uL Final  . Hemoglobin 09/20/2014 14.9  13.0 - 17.0 g/dL Final  . HCT 09/20/2014 40.4  39.0 - 52.0 % Final  . MCV 09/20/2014 80.5  78.0 - 100.0 fL Final  . MCH 09/20/2014 29.7  26.0 - 34.0 pg Final  . MCHC 09/20/2014 36.9* 30.0 - 36.0 g/dL Final  . RDW 09/20/2014 18.1* 11.5 - 15.5 % Final  . Platelets 09/20/2014 55* 150 - 400 K/uL Final   Comment: REPEATED TO VERIFY PLATELET COUNT CONFIRMED BY SMEAR   . Neutrophils Relative % 09/20/2014 64  43 - 77 % Final  . Neutro Abs 09/20/2014 1.9  1.7 - 7.7 K/uL Final  . Lymphocytes Relative 09/20/2014 26  12 -  46 % Final  . Lymphs Abs 09/20/2014 0.8  0.7 - 4.0 K/uL Final  . Monocytes Relative 09/20/2014 10  3 - 12 % Final  . Monocytes Absolute 09/20/2014 0.3  0.1 - 1.0 K/uL Final  . Eosinophils Relative 09/20/2014 0  0 - 5 % Final  . Eosinophils Absolute 09/20/2014 0.0  0.0 - 0.7 K/uL Final  . Basophils Relative 09/20/2014 0  0 - 1 % Final  . Basophils Absolute 09/20/2014 0.0  0.0 - 0.1 K/uL Final  . Sodium 09/20/2014 140  135 - 145 mmol/L Final  . Potassium 09/20/2014 5.0  3.5 - 5.1 mmol/L Final  . Chloride 09/20/2014 106  96 - 112 mmol/L Final  . CO2 09/20/2014 19  19 - 32 mmol/L Final  . Glucose, Bld 09/20/2014 59* 70 - 99 mg/dL Final  . BUN 09/20/2014 31* 6 - 23 mg/dL Final  .  Creatinine, Ser 09/20/2014 1.47* 0.50 - 1.35 mg/dL Final  . Calcium 09/20/2014 10.3  8.4 - 10.5 mg/dL Final  . GFR calc non Af Amer 09/20/2014 40* >90 mL/min Final  . GFR calc Af Amer 09/20/2014 47* >90 mL/min Final   Comment: (NOTE) The eGFR has been calculated using the CKD EPI equation. This calculation has not been validated in all clinical situations. eGFR's persistently <90 mL/min signify possible Chronic Kidney Disease.   . Anion gap 09/20/2014 15  5 - 15 Final  . B Natriuretic Peptide 09/20/2014 210.9* 0.0 - 100.0 pg/mL Final  . Troponin I 09/20/2014 0.03  <0.031 ng/mL Final   Comment:        NO INDICATION OF MYOCARDIAL INJURY.   . Lactic Acid, Venous 09/20/2014 2.45* 0.5 - 2.0 mmol/L Final  . Comment 09/20/2014 NOTIFIED PHYSICIAN   Final  . TSH 09/20/2014 3.212  0.350 - 4.500 uIU/mL Final  . Glucose-Capillary 09/20/2014 79  70 - 99 mg/dL Final  . Specimen Description 09/20/2014 BLOOD LEFT FOREARM   Final  . Special Requests 09/20/2014 BOTTLES DRAWN AEROBIC AND ANAEROBIC 5CC   Final  . Culture 09/20/2014    Final                   Value:VIRIDANS STREPTOCOCCUS Note: Gram Stain Report Called to,Read Back By and Verified With: DR Shanda Howells @ 3:40 PM 09/22/14 BY PEAKY Performed at [facility]   . Report Status 09/20/2014 09/23/2014 FINAL   Final  . Specimen Description 09/20/2014 BLOOD HAND RIGHT   Final  . Special Requests 09/20/2014 BOTTLES DRAWN AEROBIC AND ANAEROBIC 5CC   Final  . Culture 09/20/2014    Final                   Value:NO GROWTH 5 DAYS Performed at [facility]   . Report Status 09/20/2014 09/27/2014 FINAL   Final  . Specimen Description 09/20/2014 URINE, CLEAN CATCH   Final  . Special Requests 09/20/2014 NONE   Final  . Colony Count 09/20/2014    Final                   Value:3,000 COLONIES/ML Performed at [facility]   . Culture 09/20/2014    Final                   Value:INSIGNIFICANT GROWTH Performed at [facility]   . Report Status 09/20/2014 09/22/2014 FINAL   Final  . Color, Urine 09/20/2014 YELLOW  YELLOW Final  . APPearance 09/20/2014 CLEAR  CLEAR  Final  . Specific Gravity, Urine 09/20/2014 1.021  1.005 - 1.030 Final  . pH 09/20/2014 5.5  5.0 - 8.0 Final  . Glucose, UA 09/20/2014 NEGATIVE  NEGATIVE mg/dL Final  . Hgb urine dipstick 09/20/2014 NEGATIVE  NEGATIVE Final  . Bilirubin Urine 09/20/2014 NEGATIVE  NEGATIVE Final  . Ketones, ur 09/20/2014 40* NEGATIVE mg/dL Final  . Protein, ur 09/20/2014 NEGATIVE  NEGATIVE mg/dL Final  . Urobilinogen, UA 09/20/2014 1.0  0.0 - 1.0 mg/dL Final  . Nitrite 09/20/2014 NEGATIVE  NEGATIVE Final  . Leukocytes, UA 09/20/2014 NEGATIVE  NEGATIVE Final   MICROSCOPIC NOT DONE ON URINES WITH NEGATIVE PROTEIN, BLOOD, LEUKOCYTES, NITRITE, OR GLUCOSE <1000 mg/dL.  . Lactic Acid, Venous 09/20/2014 1.76  0.5 - 2.0 mmol/L Final  . MRSA by PCR 09/20/2014 NEGATIVE  NEGATIVE Final   Comment:        The GeneXpert MRSA Assay (FDA approved for NASAL specimens only), is one component of a comprehensive MRSA colonization surveillance program. It is not intended to diagnose MRSA infection nor to guide or monitor treatment for MRSA infections.   . Glucose-Capillary 09/20/2014 105* 70 - 99 mg/dL Final  . Comment 1 09/20/2014 Capillary Specimen   Final  Admission on 08/10/2014, Discharged on 08/14/2014  Component Date Value Ref Range Status  . WBC 08/10/2014 2.8* 4.0 - 10.5 K/uL Final  . RBC 08/10/2014 4.54  4.22 - 5.81 MIL/uL Final  . Hemoglobin 08/10/2014 13.1  13.0 - 17.0 g/dL Final  . HCT 08/10/2014 36.3* 39.0 - 52.0 % Final  . MCV 08/10/2014 80.0  78.0 - 100.0 fL Final  . MCH 08/10/2014 28.9  26.0 - 34.0 pg Final  . MCHC 08/10/2014 36.1* 30.0 - 36.0 g/dL Final  . RDW 08/10/2014 17.9* 11.5 - 15.5 % Final  . Platelets 08/10/2014 62* 150 - 400 K/uL Final   Comment: PLATELET COUNT CONFIRMED BY SMEAR SPECIMEN CHECKED FOR CLOTS REPEATED TO VERIFY   .  Neutrophils Relative % 08/10/2014 60  43 - 77 % Final  . Neutro Abs 08/10/2014 1.7  1.7 - 7.7 K/uL Final  . Lymphocytes Relative 08/10/2014 28  12 - 46 % Final  . Lymphs Abs 08/10/2014 0.8  0.7 - 4.0 K/uL Final  . Monocytes Relative 08/10/2014 10  3 - 12 % Final  . Monocytes Absolute 08/10/2014 0.3  0.1 - 1.0 K/uL Final  . Eosinophils Relative 08/10/2014 1  0 - 5 % Final  . Eosinophils Absolute 08/10/2014 0.0  0.0 - 0.7 K/uL Final  . Basophils Relative 08/10/2014 0  0 - 1 % Final  . Basophils Absolute 08/10/2014 0.0  0.0 - 0.1 K/uL Final  . Sodium 08/10/2014 141  135 - 145 mmol/L Final  . Potassium 08/10/2014 4.0  3.5 - 5.1 mmol/L Final  . Chloride 08/10/2014 106  96 - 112 mmol/L Final  . CO2 08/10/2014 31  19 - 32 mmol/L Final  . Glucose, Bld 08/10/2014 134* 70 - 99 mg/dL Final  . BUN 08/10/2014 21  6 - 23 mg/dL Final  . Creatinine, Ser 08/10/2014 1.23  0.50 - 1.35 mg/dL Final  . Calcium 08/10/2014 9.7  8.4 - 10.5 mg/dL Final  . GFR calc non Af Amer 08/10/2014 50* >90 mL/min Final  . GFR calc Af Amer 08/10/2014 58* >90 mL/min Final   Comment: (NOTE) The eGFR has been calculated using the CKD EPI equation. This calculation has not been validated in all clinical situations. eGFR's persistently <90 mL/min signify possible Chronic Kidney Disease.   Marland Kitchen  Anion gap 08/10/2014 4* 5 - 15 Final  . Specimen Description 08/10/2014 BLOOD RIGHT ANTECUBITAL   Final  . Special Requests 08/10/2014 BOTTLES DRAWN AEROBIC AND ANAEROBIC 5CC EACH   Final  . Culture 08/10/2014    Final                   Value:NO GROWTH 5 DAYS Performed at [facility]   . Report Status 08/10/2014 08/16/2014 FINAL   Final  . Specimen Description 08/10/2014 BLOOD RIGHT HAND   Final  . Special Requests 08/10/2014 BOTTLES DRAWN AEROBIC AND ANAEROBIC 5CC   Final  . Culture 08/10/2014    Final                   Value:NO GROWTH 5 DAYS Performed at [facility]   . Report Status 08/10/2014 08/16/2014 FINAL    Final  . Lactic Acid, Venous 08/10/2014 1.99  0.5 - 2.0 mmol/L Final  . Color, Urine 08/10/2014 RED* YELLOW Final   BIOCHEMICALS MAY BE AFFECTED BY COLOR  . APPearance 08/10/2014 CLOUDY* CLEAR Final  . Specific Gravity, Urine 08/10/2014 1.023  1.005 - 1.030 Final  . pH 08/10/2014 5.0  5.0 - 8.0 Final  . Glucose, UA 08/10/2014 NEGATIVE  NEGATIVE mg/dL Final  . Hgb urine dipstick 08/10/2014 LARGE* NEGATIVE Final  . Bilirubin Urine 08/10/2014 SMALL* NEGATIVE Final  . Ketones, ur 08/10/2014 15* NEGATIVE mg/dL Final  . Protein, ur 08/10/2014 30* NEGATIVE mg/dL Final  . Urobilinogen, UA 08/10/2014 1.0  0.0 - 1.0 mg/dL Final  . Nitrite 08/10/2014 NEGATIVE  NEGATIVE Final  . Leukocytes, UA 08/10/2014 SMALL* NEGATIVE Final  . Specimen Description 08/10/2014 URINE, CATHETERIZED   Final  . Special Requests 08/10/2014 NONE   Final  . Colony Count 08/10/2014    Final                   Value:3,000 COLONIES/ML Performed at [facility]   . Culture 08/10/2014    Final                   Value:STAPHYLOCOCCUS SPECIES (COAGULASE NEGATIVE) Note: RIFAMPIN AND GENTAMICIN SHOULD NOT BE USED AS SINGLE DRUGS FOR TREATMENT OF STAPH INFECTIONS. Performed at [facility]   . Report Status 08/10/2014 08/13/2014 FINAL   Final  . Organism ID, Bacteria 08/10/2014 STAPHYLOCOCCUS SPECIES (COAGULASE NEGATIVE)   Final  . Squamous Epithelial / LPF 08/10/2014 RARE  RARE Final  . WBC, UA 08/10/2014 3-6  <3 WBC/hpf Final  . RBC / HPF 08/10/2014 TOO NUMEROUS TO COUNT  <3 RBC/hpf Final  . Bacteria, UA 08/10/2014 RARE  RARE Final  . Troponin I 08/10/2014 <0.03  <0.031 ng/mL Final   Comment:        NO INDICATION OF MYOCARDIAL INJURY.   . B Natriuretic Peptide 08/10/2014 189.0* 0.0 - 100.0 pg/mL Final  . TSH 08/10/2014 3.419  0.350 - 4.500 uIU/mL Final  . Lactic Acid, Venous 08/10/2014 1.08  0.5 - 2.0 mmol/L Final  . MRSA by PCR 08/10/2014 NEGATIVE  NEGATIVE Final   Comment:        The GeneXpert MRSA  Assay (FDA approved for NASAL specimens only), is one component of a comprehensive MRSA colonization surveillance program. It is not intended to diagnose MRSA infection nor to guide or monitor treatment for MRSA infections.   . WBC 08/11/2014 2.9* 4.0 - 10.5 K/uL Final  . RBC 08/11/2014 4.64  4.22 - 5.81 MIL/uL Final  .  Hemoglobin 08/11/2014 13.4  13.0 - 17.0 g/dL Final  . HCT 08/11/2014 37.7* 39.0 - 52.0 % Final  . MCV 08/11/2014 81.3  78.0 - 100.0 fL Final  . MCH 08/11/2014 28.9  26.0 - 34.0 pg Final  . MCHC 08/11/2014 35.5  30.0 - 36.0 g/dL Final  . RDW 08/11/2014 18.3* 11.5 - 15.5 % Final  . Platelets 08/11/2014 54* 150 - 400 K/uL Final   Comment: REPEATED TO VERIFY CONSISTENT WITH PREVIOUS RESULT   . Sodium 08/11/2014 142  135 - 145 mmol/L Final  . Potassium 08/11/2014 4.0  3.5 - 5.1 mmol/L Final  . Chloride 08/11/2014 110  96 - 112 mmol/L Final  . CO2 08/11/2014 22  19 - 32 mmol/L Final  . Glucose, Bld 08/11/2014 92  70 - 99 mg/dL Final  . BUN 08/11/2014 14  6 - 23 mg/dL Final  . Creatinine, Ser 08/11/2014 1.24  0.50 - 1.35 mg/dL Final  . Calcium 08/11/2014 9.2  8.4 - 10.5 mg/dL Final  . GFR calc non Af Amer 08/11/2014 50* >90 mL/min Final  . GFR calc Af Amer 08/11/2014 58* >90 mL/min Final   Comment: (NOTE) The eGFR has been calculated using the CKD EPI equation. This calculation has not been validated in all clinical situations. eGFR's persistently <90 mL/min signify possible Chronic Kidney Disease.   . Anion gap 08/11/2014 10  5 - 15 Final  . Sodium 08/12/2014 143  135 - 145 mmol/L Final  . Potassium 08/12/2014 3.3* 3.5 - 5.1 mmol/L Final  . Chloride 08/12/2014 111  96 - 112 mmol/L Final  . CO2 08/12/2014 26  19 - 32 mmol/L Final  . Glucose, Bld 08/12/2014 75  70 - 99 mg/dL Final  . BUN 08/12/2014 12  6 - 23 mg/dL Final  . Creatinine, Ser 08/12/2014 1.13  0.50 - 1.35 mg/dL Final  . Calcium 08/12/2014 9.2  8.4 - 10.5 mg/dL Final  . GFR calc non Af Amer  08/12/2014 56* >90 mL/min Final  . GFR calc Af Amer 08/12/2014 64* >90 mL/min Final   Comment: (NOTE) The eGFR has been calculated using the CKD EPI equation. This calculation has not been validated in all clinical situations. eGFR's persistently <90 mL/min signify possible Chronic Kidney Disease.   . Anion gap 08/12/2014 6  5 - 15 Final  . Sodium 08/13/2014 143  135 - 145 mmol/L Final  . Potassium 08/13/2014 3.8  3.5 - 5.1 mmol/L Final  . Chloride 08/13/2014 110  96 - 112 mmol/L Final  . CO2 08/13/2014 25  19 - 32 mmol/L Final  . Glucose, Bld 08/13/2014 74  70 - 99 mg/dL Final  . BUN 08/13/2014 12  6 - 23 mg/dL Final  . Creatinine, Ser 08/13/2014 1.14  0.50 - 1.35 mg/dL Final  . Calcium 08/13/2014 9.3  8.4 - 10.5 mg/dL Final  . GFR calc non Af Amer 08/13/2014 55* >90 mL/min Final  . GFR calc Af Amer 08/13/2014 64* >90 mL/min Final   Comment: (NOTE) The eGFR has been calculated using the CKD EPI equation. This calculation has not been validated in all clinical situations. eGFR's persistently <90 mL/min signify possible Chronic Kidney Disease.   . Anion gap 08/13/2014 8  5 - 15 Final  Admission on 07/04/2014, Discharged on 07/07/2014  Component Date Value Ref Range Status  . WBC 07/04/2014 2.4* 4.0 - 10.5 K/uL Final  . RBC 07/04/2014 4.67  4.22 - 5.81 MIL/uL Final  . Hemoglobin 07/04/2014 13.0  13.0 -  17.0 g/dL Final  . HCT 07/04/2014 37.2* 39.0 - 52.0 % Final  . MCV 07/04/2014 79.7  78.0 - 100.0 fL Final  . MCH 07/04/2014 27.8  26.0 - 34.0 pg Final  . MCHC 07/04/2014 34.9  30.0 - 36.0 g/dL Final  . RDW 07/04/2014 16.9* 11.5 - 15.5 % Final  . Platelets 07/04/2014 45* 150 - 400 K/uL Final   Comment: PLATELET COUNT CONFIRMED BY SMEAR SPECIMEN CHECKED FOR CLOTS   . Neutrophils Relative % 07/04/2014 65  43 - 77 % Final  . Neutro Abs 07/04/2014 1.6* 1.7 - 7.7 K/uL Final  . Lymphocytes Relative 07/04/2014 24  12 - 46 % Final  . Lymphs Abs 07/04/2014 0.6* 0.7 - 4.0 K/uL Final  .  Monocytes Relative 07/04/2014 11  3 - 12 % Final  . Monocytes Absolute 07/04/2014 0.3  0.1 - 1.0 K/uL Final  . Eosinophils Relative 07/04/2014 1  0 - 5 % Final  . Eosinophils Absolute 07/04/2014 0.0  0.0 - 0.7 K/uL Final  . Basophils Relative 07/04/2014 0  0 - 1 % Final  . Basophils Absolute 07/04/2014 0.0  0.0 - 0.1 K/uL Final  . Sodium 07/04/2014 142  135 - 145 mmol/L Final  . Potassium 07/04/2014 4.1  3.5 - 5.1 mmol/L Final  . Chloride 07/04/2014 108  96 - 112 mmol/L Final  . CO2 07/04/2014 22  19 - 32 mmol/L Final  . Glucose, Bld 07/04/2014 85  70 - 99 mg/dL Final  . BUN 07/04/2014 24* 6 - 23 mg/dL Final  . Creatinine, Ser 07/04/2014 1.43* 0.50 - 1.35 mg/dL Final  . Calcium 07/04/2014 9.6  8.4 - 10.5 mg/dL Final  . Total Protein 07/04/2014 7.3  6.0 - 8.3 g/dL Final  . Albumin 07/04/2014 3.2* 3.5 - 5.2 g/dL Final  . AST 07/04/2014 29  0 - 37 U/L Final  . ALT 07/04/2014 14  0 - 53 U/L Final  . Alkaline Phosphatase 07/04/2014 98  39 - 117 U/L Final  . Total Bilirubin 07/04/2014 1.1  0.3 - 1.2 mg/dL Final  . GFR calc non Af Amer 07/04/2014 42* >90 mL/min Final  . GFR calc Af Amer 07/04/2014 49* >90 mL/min Final   Comment: (NOTE) The eGFR has been calculated using the CKD EPI equation. This calculation has not been validated in all clinical situations. eGFR's persistently <90 mL/min signify possible Chronic Kidney Disease.   . Anion gap 07/04/2014 12  5 - 15 Final  . B Natriuretic Peptide 07/04/2014 187.7* 0.0 - 100.0 pg/mL Final  . Troponin I 07/04/2014 <0.03  <0.031 ng/mL Final   Comment:        NO INDICATION OF MYOCARDIAL INJURY.   . Troponin I 07/04/2014 <0.03  <0.031 ng/mL Final   Comment:        NO INDICATION OF MYOCARDIAL INJURY.   . Troponin I 07/05/2014 <0.03  <0.031 ng/mL Final   Comment:        NO INDICATION OF MYOCARDIAL INJURY.   . TSH 07/04/2014 3.759  0.350 - 4.500 uIU/mL Final  . Sodium 07/05/2014 142  135 - 145 mmol/L Final  . Potassium 07/05/2014 3.9   3.5 - 5.1 mmol/L Final  . Chloride 07/05/2014 104  96 - 112 mmol/L Final  . CO2 07/05/2014 32  19 - 32 mmol/L Final  . Glucose, Bld 07/05/2014 85  70 - 99 mg/dL Final  . BUN 07/05/2014 22  6 - 23 mg/dL Final  . Creatinine, Ser 07/05/2014 1.42* 0.50 -  1.35 mg/dL Final  . Calcium 07/05/2014 9.7  8.4 - 10.5 mg/dL Final  . GFR calc non Af Amer 07/05/2014 42* >90 mL/min Final  . GFR calc Af Amer 07/05/2014 49* >90 mL/min Final   Comment: (NOTE) The eGFR has been calculated using the CKD EPI equation. This calculation has not been validated in all clinical situations. eGFR's persistently <90 mL/min signify possible Chronic Kidney Disease.   . Anion gap 07/05/2014 6  5 - 15 Final  . Sodium 07/06/2014 142  135 - 145 mmol/L Final  . Potassium 07/06/2014 4.1  3.5 - 5.1 mmol/L Final  . Chloride 07/06/2014 105  96 - 112 mmol/L Final  . CO2 07/06/2014 27  19 - 32 mmol/L Final  . Glucose, Bld 07/06/2014 70  70 - 99 mg/dL Final  . BUN 07/06/2014 24* 6 - 23 mg/dL Final  . Creatinine, Ser 07/06/2014 1.42* 0.50 - 1.35 mg/dL Final  . Calcium 07/06/2014 9.6  8.4 - 10.5 mg/dL Final  . GFR calc non Af Amer 07/06/2014 42* >90 mL/min Final  . GFR calc Af Amer 07/06/2014 49* >90 mL/min Final   Comment: (NOTE) The eGFR has been calculated using the CKD EPI equation. This calculation has not been validated in all clinical situations. eGFR's persistently <90 mL/min signify possible Chronic Kidney Disease.   . Anion gap 07/06/2014 10  5 - 15 Final  . WBC 07/06/2014 2.2* 4.0 - 10.5 K/uL Final  . RBC 07/06/2014 4.37  4.22 - 5.81 MIL/uL Final  . Hemoglobin 07/06/2014 12.4* 13.0 - 17.0 g/dL Final  . HCT 07/06/2014 35.5* 39.0 - 52.0 % Final  . MCV 07/06/2014 81.2  78.0 - 100.0 fL Final  . MCH 07/06/2014 28.4  26.0 - 34.0 pg Final  . MCHC 07/06/2014 34.9  30.0 - 36.0 g/dL Final  . RDW 07/06/2014 16.9* 11.5 - 15.5 % Final  . Platelets 07/06/2014 53* 150 - 400 K/uL Final   Comment: REPEATED TO VERIFY PLATELET  COUNT CONFIRMED BY SMEAR CONSISTENT WITH PREVIOUS RESULT   . Glucose-Capillary 07/06/2014 101* 70 - 99 mg/dL Final  . Comment 1 07/06/2014 Notify RN   Final  . Color, Urine 07/06/2014 YELLOW  YELLOW Final  . APPearance 07/06/2014 CLEAR  CLEAR Final  . Specific Gravity, Urine 07/06/2014 1.013  1.005 - 1.030 Final  . pH 07/06/2014 5.0  5.0 - 8.0 Final  . Glucose, UA 07/06/2014 NEGATIVE  NEGATIVE mg/dL Final  . Hgb urine dipstick 07/06/2014 NEGATIVE  NEGATIVE Final  . Bilirubin Urine 07/06/2014 NEGATIVE  NEGATIVE Final  . Ketones, ur 07/06/2014 NEGATIVE  NEGATIVE mg/dL Final  . Protein, ur 07/06/2014 NEGATIVE  NEGATIVE mg/dL Final  . Urobilinogen, UA 07/06/2014 1.0  0.0 - 1.0 mg/dL Final  . Nitrite 07/06/2014 NEGATIVE  NEGATIVE Final  . Leukocytes, UA 07/06/2014 NEGATIVE  NEGATIVE Final   MICROSCOPIC NOT DONE ON URINES WITH NEGATIVE PROTEIN, BLOOD, LEUKOCYTES, NITRITE, OR GLUCOSE <1000 mg/dL.  Marland Kitchen Sodium 07/07/2014 146* 135 - 145 mmol/L Final  . Potassium 07/07/2014 3.7  3.5 - 5.1 mmol/L Final  . Chloride 07/07/2014 108  96 - 112 mmol/L Final  . CO2 07/07/2014 34* 19 - 32 mmol/L Final  . Glucose, Bld 07/07/2014 70  70 - 99 mg/dL Final  . BUN 07/07/2014 24* 6 - 23 mg/dL Final  . Creatinine, Ser 07/07/2014 1.53* 0.50 - 1.35 mg/dL Final  . Calcium 07/07/2014 9.8  8.4 - 10.5 mg/dL Final  . GFR calc non Af Amer 07/07/2014 39* >90 mL/min  Final  . GFR calc Af Amer 07/07/2014 45* >90 mL/min Final   Comment: (NOTE) The eGFR has been calculated using the CKD EPI equation. This calculation has not been validated in all clinical situations. eGFR's persistently <90 mL/min signify possible Chronic Kidney Disease.   . Anion gap 07/07/2014 4* 5 - 15 Final    Dg Chest Portable 1 View  09/23/2014   CLINICAL DATA:  Wound infection.  Bilateral leg swelling  EXAM: PORTABLE CHEST - 1 VIEW  COMPARISON:  09/20/2014  FINDINGS: Cardiac enlargement. Median sternotomy. Negative for heart failure. Lungs are  clear without infiltrate or effusion.  IMPRESSION: No active disease.   Electronically Signed   By: Franchot Gallo M.D.   On: 09/23/2014 20:09   Dg Chest Portable 1 View  09/20/2014   CLINICAL DATA:  Bilateral leg swelling, history of hypertension and CHF  EXAM: PORTABLE CHEST - 1 VIEW  COMPARISON:  08/10/2014  FINDINGS: Postsurgical changes are again noted. The cardiac shadow is at the upper limits of normal in size. No significant vascular congestion is noted. No focal infiltrate is seen. No bony abnormality is noted.  IMPRESSION: No active disease.   Electronically Signed   By: Inez Catalina M.D.   On: 09/20/2014 19:00     Assessment/Plan   ICD-9-CM ICD-10-CM   1. Bilateral leg edema - stable 782.3 R60.0   2. Chronic diastolic heart failure - stable 428.32 I50.32   3. Dementia, without behavioral disturbance - stable 294.20 F03.90   4. Essential hypertension - controlled 401.9 I10   5. Thrombocytopenia - stable 287.5 D69.6   6. Neutropenia - stable 288.00 D70.9   7. Generalized anxiety disorder - stable 300.02 F41.1   8. Cellulitis of right lower leg - resolved 682.6 L03.115    --PT/ST as ordered  --cont meds as ordered  --f/u with specialists (cardio) as scheduled  --daily weights as ordered  --follow plts  --cont wound care  --GOAL: short term rehab and d/c home when medically appropriate. Communicated with pt and nursing.  Laquilla Dault S. Perlie Gold  [facility] [address] [place], [place] [address] [phone] Cell (Monday-Friday 8 AM - 5 PM) [phone] After 5 PM and follow prompts

## 2014-11-01 DEATH — deceased

## 2014-11-20 ENCOUNTER — Ambulatory Visit: Payer: Medicare HMO | Admitting: Podiatry

## 2014-11-30 NOTE — Progress Notes (Signed)
This encounter was created in error - please disregard.

## 2014-12-14 DIAGNOSIS — I5032 Chronic diastolic (congestive) heart failure: Secondary | ICD-10-CM | POA: Insufficient documentation

## 2014-12-14 DIAGNOSIS — F0390 Unspecified dementia without behavioral disturbance: Secondary | ICD-10-CM | POA: Insufficient documentation

## 2014-12-14 DIAGNOSIS — I10 Essential (primary) hypertension: Secondary | ICD-10-CM | POA: Insufficient documentation

## 2014-12-14 DIAGNOSIS — L03115 Cellulitis of right lower limb: Secondary | ICD-10-CM | POA: Insufficient documentation

## 2014-12-14 DIAGNOSIS — F411 Generalized anxiety disorder: Secondary | ICD-10-CM | POA: Insufficient documentation

## 2014-12-14 DIAGNOSIS — D709 Neutropenia, unspecified: Secondary | ICD-10-CM | POA: Insufficient documentation

## 2014-12-14 DIAGNOSIS — D696 Thrombocytopenia, unspecified: Secondary | ICD-10-CM | POA: Insufficient documentation

## 2017-01-16 IMAGING — CR DG CHEST 2V
2 series · 2 of 2 positions shown · non-contrast
Comparison: 04/22/2007

CLINICAL DATA: Acute left systolic heart failure.

EXAM:
CHEST  2 VIEW

[w chest pa]
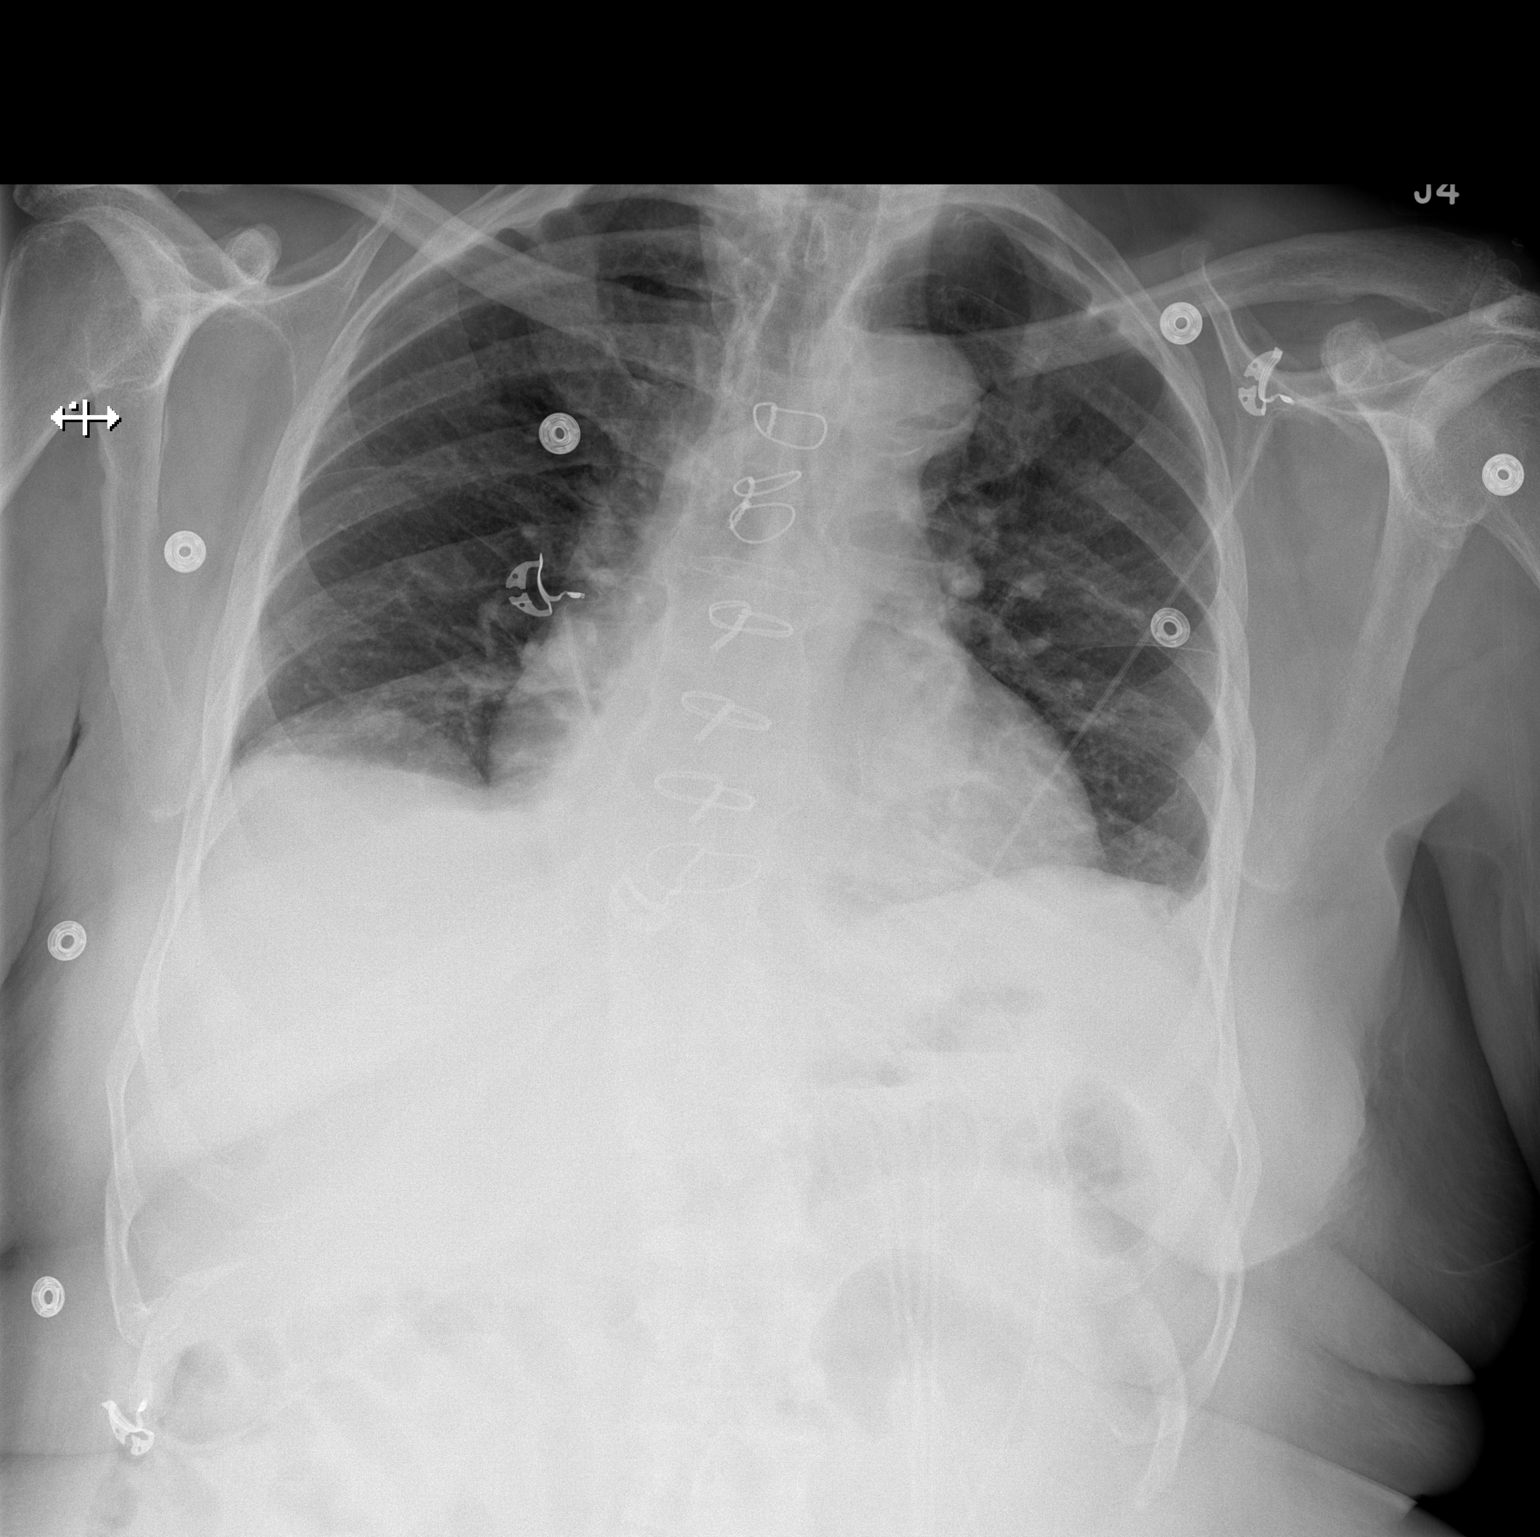

[w chest lat]
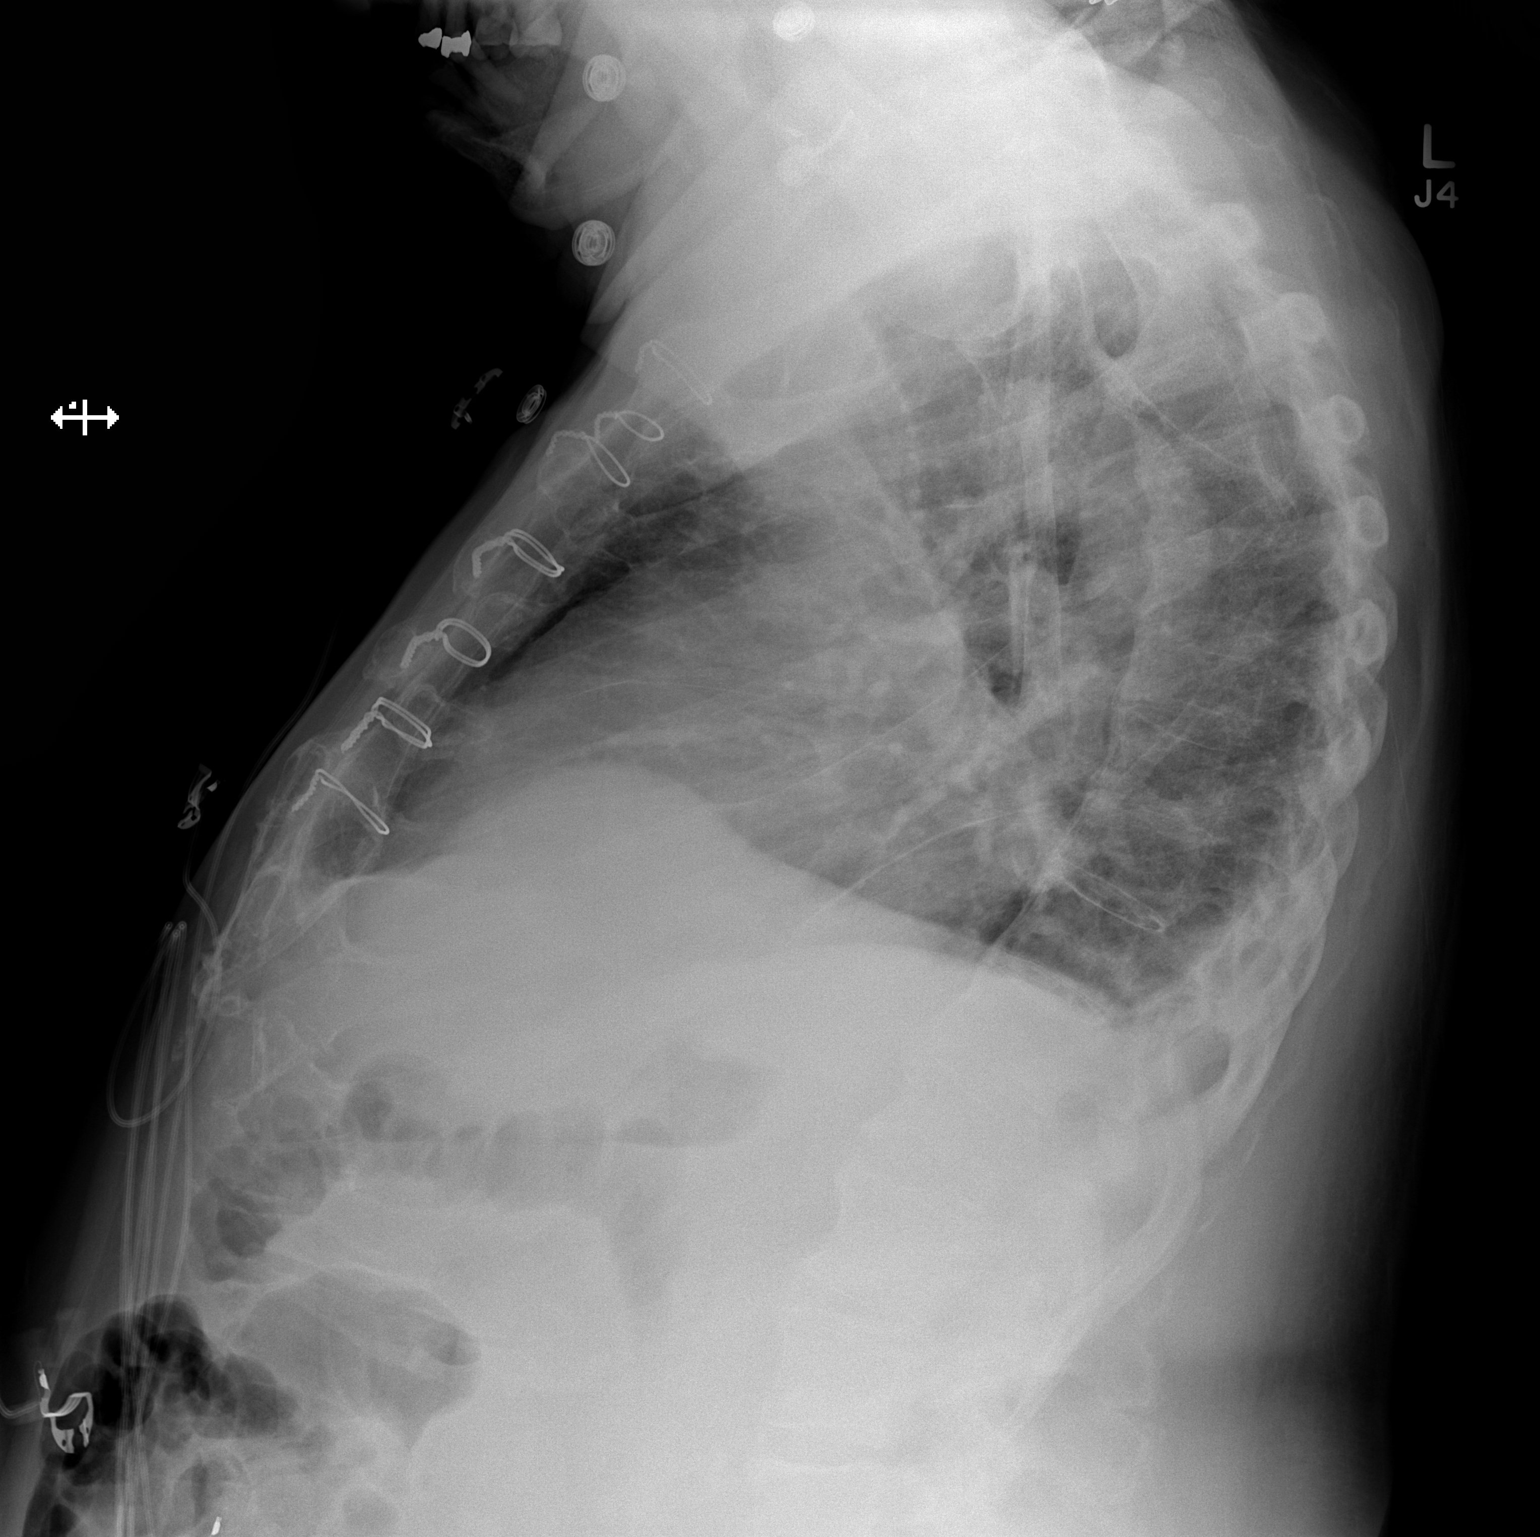

[2 of 2 positions shown; findings below may reference images not displayed]

FINDINGS: There is new slight cardiomegaly with small bilateral pleural
effusions. Pulmonary vascularity is at the upper limits of normal.
No infiltrates. No acute osseous abnormalities.
IMPRESSION: New slight cardiomegaly and bilateral pleural effusions.

## 2017-02-21 IMAGING — CR DG CHEST 1V PORT
1 series · 1 of 1 positions shown · non-contrast
Comparison: Frontal and lateral views 07/05/2014

CLINICAL DATA: Hypothermia.  History of hypertension and CHF.

EXAM:
PORTABLE CHEST - 1 VIEW

[AP]
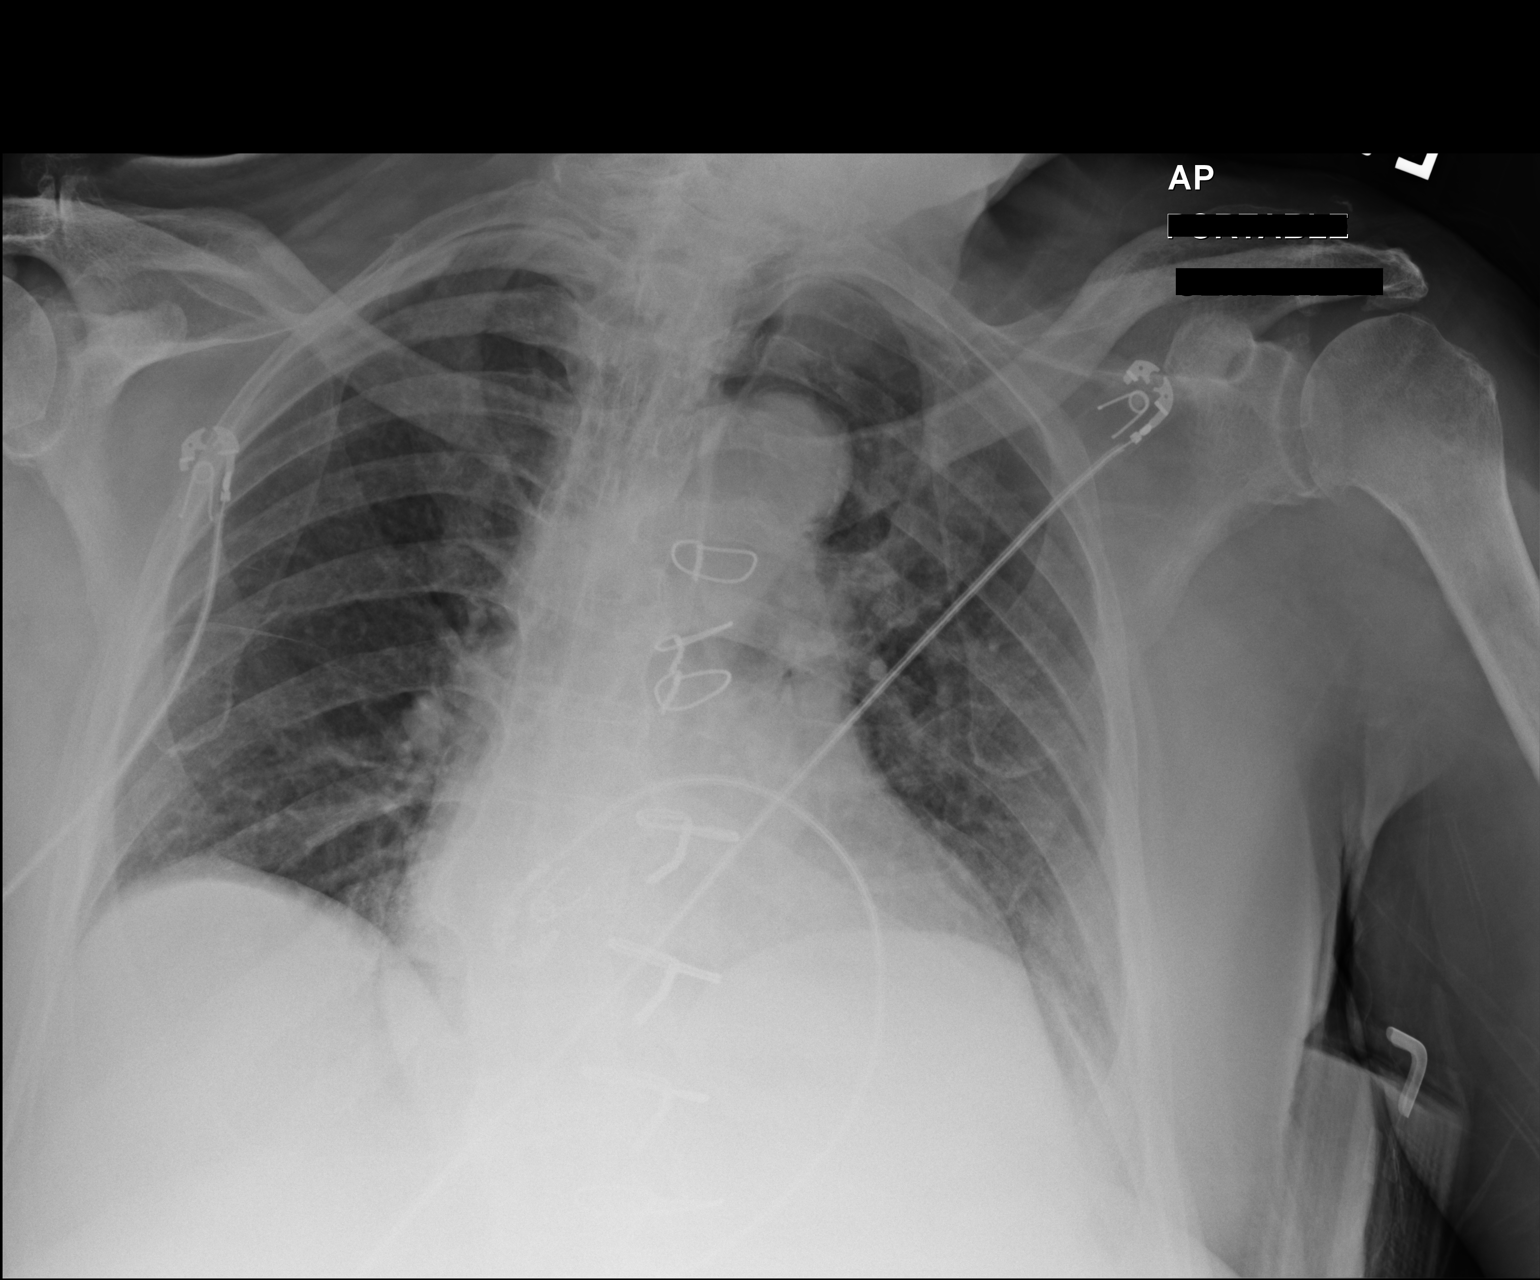

[1 of 1 positions shown; findings below may reference images not displayed]

FINDINGS: Patient is post median sternotomy. The heart is enlarged. Lung
volumes are low. Probable bilateral small pleural effusions, similar
to prior exam. There is no pulmonary edema. No confluent airspace
disease. No pneumothorax.
IMPRESSION: Hypoventilatory chest with grossly stable cardiomegaly and likely
small bilateral pleural effusions. No significant change compared to
prior exam allowing for differences in technique.

## 2017-04-06 IMAGING — CR DG CHEST 1V PORT
1 series · 1 of 1 positions shown · non-contrast
Comparison: 09/20/2014

CLINICAL DATA: Wound infection.  Bilateral leg swelling

EXAM:
PORTABLE CHEST - 1 VIEW

[AP]
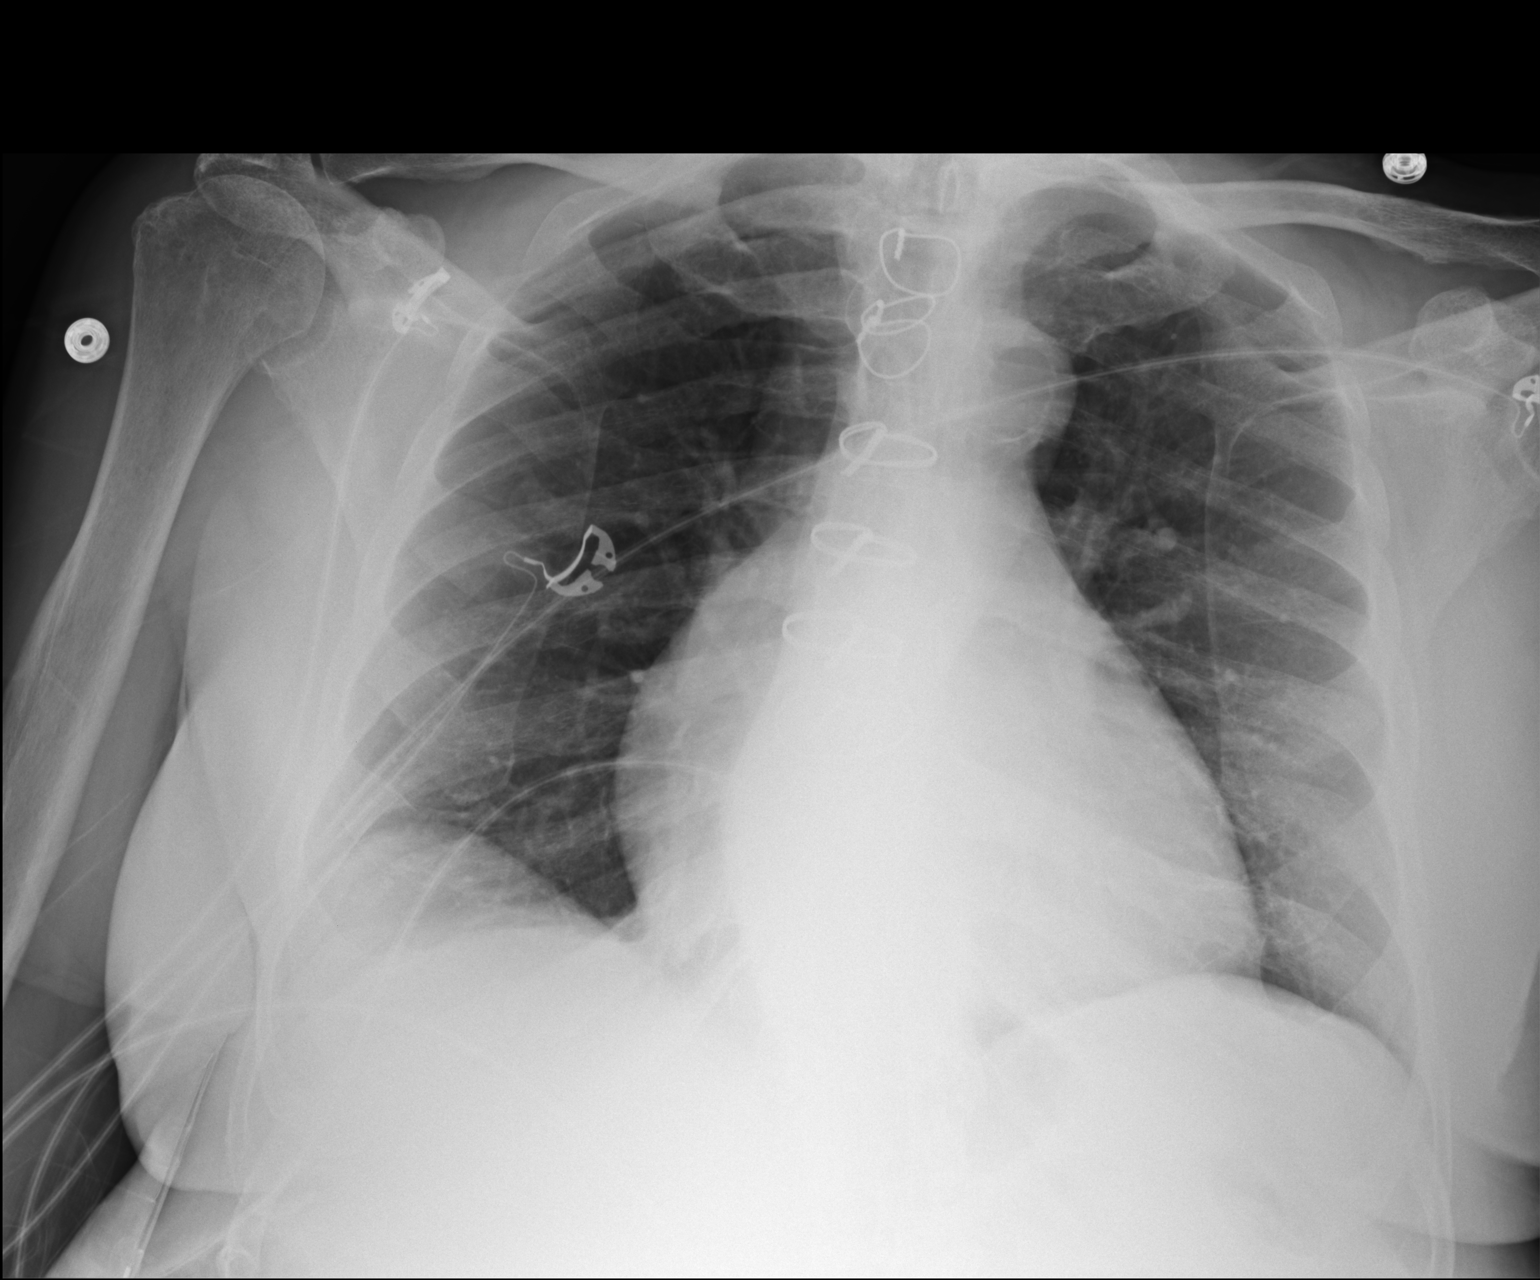

[1 of 1 positions shown; findings below may reference images not displayed]

FINDINGS: Cardiac enlargement. Median sternotomy. Negative for heart failure.
Lungs are clear without infiltrate or effusion.
IMPRESSION: No active disease.
